# Patient Record
Sex: Female | Born: 1945
Health system: Southern US, Community
[De-identification: ages and names within clinical notes are randomized; demographics above are authoritative.]

## PROBLEM LIST (undated history)

## (undated) DIAGNOSIS — J309 Allergic rhinitis, unspecified: Secondary | ICD-10-CM

## (undated) DIAGNOSIS — M5126 Other intervertebral disc displacement, lumbar region: Secondary | ICD-10-CM

## (undated) DIAGNOSIS — D649 Anemia, unspecified: Secondary | ICD-10-CM

## (undated) DIAGNOSIS — J45909 Unspecified asthma, uncomplicated: Secondary | ICD-10-CM

## (undated) DIAGNOSIS — N809 Endometriosis, unspecified: Secondary | ICD-10-CM

## (undated) DIAGNOSIS — J189 Pneumonia, unspecified organism: Secondary | ICD-10-CM

## (undated) DIAGNOSIS — I340 Nonrheumatic mitral (valve) insufficiency: Secondary | ICD-10-CM

## (undated) DIAGNOSIS — N1831 Chronic kidney disease, stage 3a: Secondary | ICD-10-CM

## (undated) DIAGNOSIS — I10 Essential (primary) hypertension: Secondary | ICD-10-CM

## (undated) DIAGNOSIS — R42 Dizziness and giddiness: Secondary | ICD-10-CM

## (undated) DIAGNOSIS — H8109 Meniere's disease, unspecified ear: Secondary | ICD-10-CM

## (undated) DIAGNOSIS — H269 Unspecified cataract: Secondary | ICD-10-CM

## (undated) DIAGNOSIS — K635 Polyp of colon: Secondary | ICD-10-CM

## (undated) DIAGNOSIS — J449 Chronic obstructive pulmonary disease, unspecified: Secondary | ICD-10-CM

## (undated) HISTORY — PX: COLONOSCOPY: SHX174

## (undated) HISTORY — PX: ABDOMINAL HYSTERECTOMY: SHX81

## (undated) HISTORY — PX: FOOT SURGERY: SHX648

## (undated) HISTORY — PX: TOTAL ABDOMINAL HYSTERECTOMY W/ BILATERAL SALPINGOOPHORECTOMY: SHX83

## (undated) HISTORY — PX: TONSILLECTOMY: SUR1361

## (undated) HISTORY — PX: CATARACT EXTRACTION W/ INTRAOCULAR LENS  IMPLANT, BILATERAL: SHX1307

---

## 2007-03-21 DIAGNOSIS — B89 Unspecified parasitic disease: Secondary | ICD-10-CM

## 2007-03-21 HISTORY — DX: Unspecified parasitic disease: B89

## 2010-09-05 ENCOUNTER — Ambulatory Visit: Payer: Self-pay | Admitting: Family Medicine

## 2010-12-14 ENCOUNTER — Ambulatory Visit: Payer: Self-pay | Admitting: Ophthalmology

## 2011-01-11 ENCOUNTER — Ambulatory Visit: Payer: Self-pay | Admitting: Ophthalmology

## 2011-09-27 ENCOUNTER — Ambulatory Visit: Payer: Self-pay | Admitting: Family Medicine

## 2012-04-25 ENCOUNTER — Ambulatory Visit: Payer: Self-pay | Admitting: Podiatry

## 2012-05-07 ENCOUNTER — Ambulatory Visit: Payer: Self-pay | Admitting: Podiatry

## 2012-05-07 HISTORY — PX: FOOT SURGERY: SHX648

## 2012-05-13 ENCOUNTER — Inpatient Hospital Stay: Payer: Self-pay | Admitting: Internal Medicine

## 2012-05-13 ENCOUNTER — Ambulatory Visit: Payer: Self-pay | Admitting: Podiatry

## 2012-05-13 DIAGNOSIS — I2699 Other pulmonary embolism without acute cor pulmonale: Secondary | ICD-10-CM

## 2012-05-13 HISTORY — DX: Other pulmonary embolism without acute cor pulmonale: I26.99

## 2012-05-13 LAB — CBC
HGB: 11.1 g/dL — ABNORMAL LOW (ref 12.0–16.0)
MCH: 31.8 pg (ref 26.0–34.0)
RDW: 12.9 % (ref 11.5–14.5)
WBC: 12.3 10*3/uL — ABNORMAL HIGH (ref 3.6–11.0)

## 2012-05-13 LAB — BASIC METABOLIC PANEL
BUN: 35 mg/dL — ABNORMAL HIGH (ref 7–18)
Co2: 26 mmol/L (ref 21–32)
Creatinine: 1.48 mg/dL — ABNORMAL HIGH (ref 0.60–1.30)
EGFR (Non-African Amer.): 37 — ABNORMAL LOW
Glucose: 96 mg/dL (ref 65–99)
Osmolality: 276 (ref 275–301)
Potassium: 3.6 mmol/L (ref 3.5–5.1)

## 2012-05-13 LAB — APTT
Activated PTT: 31.5 secs (ref 23.6–35.9)
Activated PTT: 55.9 secs — ABNORMAL HIGH (ref 23.6–35.9)

## 2012-05-14 LAB — BASIC METABOLIC PANEL
Anion Gap: 8 (ref 7–16)
Creatinine: 1 mg/dL (ref 0.60–1.30)
EGFR (African American): >60
EGFR (Non-African Amer.): 59 — ABNORMAL LOW
Glucose: 94 mg/dL (ref 65–99)
Osmolality: 280 (ref 275–301)
Potassium: 3.5 mmol/L (ref 3.5–5.1)
Sodium: 138 mmol/L (ref 136–145)

## 2012-05-14 LAB — CBC WITH DIFFERENTIAL/PLATELET
Basophil %: 0.3 %
Lymphocyte #: 1.8 10*3/uL (ref 1.0–3.6)
MCH: 32.2 pg (ref 26.0–34.0)
MCV: 95 fL (ref 80–100)
Monocyte #: 0.8 x10 3/mm (ref 0.2–0.9)
Neutrophil #: 4.5 10*3/uL (ref 1.4–6.5)
Neutrophil %: 62 %
RDW: 12.9 % (ref 11.5–14.5)
WBC: 7.3 10*3/uL (ref 3.6–11.0)

## 2012-05-14 LAB — APTT
Activated PTT: 57.5 secs — ABNORMAL HIGH (ref 23.6–35.9)
Activated PTT: 54.4 secs — ABNORMAL HIGH (ref 23.6–35.9)

## 2012-05-14 LAB — HEPATIC FUNCTION PANEL A (ARMC)
Albumin: 2.7 g/dL — ABNORMAL LOW (ref 3.4–5.0)
Alkaline Phosphatase: 90 U/L (ref 50–136)
Bilirubin,Total: 0.3 mg/dL (ref 0.2–1.0)

## 2012-05-14 LAB — LIPID PANEL
Cholesterol: 147 mg/dL (ref 0–200)
HDL Cholesterol: 48 mg/dL (ref 40–60)
Triglycerides: 142 mg/dL (ref 0–200)

## 2012-05-15 ENCOUNTER — Ambulatory Visit: Payer: Self-pay | Admitting: Oncology

## 2012-07-16 ENCOUNTER — Ambulatory Visit: Payer: Self-pay | Admitting: Oncology

## 2012-07-18 ENCOUNTER — Ambulatory Visit: Payer: Self-pay | Admitting: Oncology

## 2012-09-27 ENCOUNTER — Ambulatory Visit: Payer: Self-pay | Admitting: Family Medicine

## 2014-01-29 ENCOUNTER — Ambulatory Visit: Payer: Self-pay | Admitting: Obstetrics and Gynecology

## 2014-03-24 ENCOUNTER — Emergency Department: Payer: Self-pay | Admitting: Emergency Medicine

## 2014-03-24 DIAGNOSIS — Z87891 Personal history of nicotine dependence: Secondary | ICD-10-CM | POA: Diagnosis not present

## 2014-03-24 DIAGNOSIS — K5792 Diverticulitis of intestine, part unspecified, without perforation or abscess without bleeding: Secondary | ICD-10-CM | POA: Diagnosis not present

## 2014-03-24 DIAGNOSIS — N281 Cyst of kidney, acquired: Secondary | ICD-10-CM | POA: Diagnosis not present

## 2014-03-24 DIAGNOSIS — K5732 Diverticulitis of large intestine without perforation or abscess without bleeding: Secondary | ICD-10-CM | POA: Diagnosis not present

## 2014-03-24 DIAGNOSIS — Z79899 Other long term (current) drug therapy: Secondary | ICD-10-CM | POA: Diagnosis not present

## 2014-03-24 DIAGNOSIS — R1030 Lower abdominal pain, unspecified: Secondary | ICD-10-CM | POA: Diagnosis not present

## 2014-03-24 LAB — URINALYSIS, COMPLETE
BILIRUBIN, UR: NEGATIVE
BLOOD: NEGATIVE
GLUCOSE, UR: NEGATIVE mg/dL (ref 0–75)
KETONE: NEGATIVE
Leukocyte Esterase: NEGATIVE
Nitrite: NEGATIVE
PH: 7 (ref 4.5–8.0)
Protein: NEGATIVE
RBC,UR: 1 /HPF (ref 0–5)
SPECIFIC GRAVITY: 1.016 (ref 1.003–1.030)
Squamous Epithelial: 3
WBC UR: <1 /HPF (ref 0–5)

## 2014-03-24 LAB — COMPREHENSIVE METABOLIC PANEL
ALT: 17 U/L
AST: 21 U/L (ref 15–37)
Albumin: 3.4 g/dL (ref 3.4–5.0)
Alkaline Phosphatase: 63 U/L
Anion Gap: 7 (ref 7–16)
BUN: 19 mg/dL — ABNORMAL HIGH (ref 7–18)
Bilirubin,Total: 0.2 mg/dL (ref 0.2–1.0)
Calcium, Total: 9.1 mg/dL (ref 8.5–10.1)
Chloride: 106 mmol/L (ref 98–107)
Co2: 28 mmol/L (ref 21–32)
Creatinine: 0.91 mg/dL (ref 0.60–1.30)
EGFR (African American): >60
EGFR (Non-African Amer.): >60
Glucose: 119 mg/dL — ABNORMAL HIGH (ref 65–99)
Osmolality: 285 (ref 275–301)
Potassium: 4 mmol/L (ref 3.5–5.1)
Sodium: 141 mmol/L (ref 136–145)
Total Protein: 7.1 g/dL (ref 6.4–8.2)

## 2014-03-24 LAB — CBC WITH DIFFERENTIAL/PLATELET
BASOS ABS: 0 10*3/uL (ref 0.0–0.1)
BASOS PCT: 0.4 %
Eosinophil #: 0.2 10*3/uL (ref 0.0–0.7)
Eosinophil %: 1.8 %
HCT: 36.9 % (ref 35.0–47.0)
HGB: 12.3 g/dL (ref 12.0–16.0)
LYMPHS ABS: 1.9 10*3/uL (ref 1.0–3.6)
Lymphocyte %: 21.4 %
MCH: 32.5 pg (ref 26.0–34.0)
MCHC: 33.3 g/dL (ref 32.0–36.0)
MCV: 98 fL (ref 80–100)
MONOS PCT: 6.7 %
Monocyte #: 0.6 x10 3/mm (ref 0.2–0.9)
NEUTROS ABS: 6.3 10*3/uL (ref 1.4–6.5)
Neutrophil %: 69.7 %
Platelet: 414 10*3/uL (ref 150–440)
RBC: 3.79 10*6/uL — AB (ref 3.80–5.20)
RDW: 12.7 % (ref 11.5–14.5)
WBC: 9 10*3/uL (ref 3.6–11.0)

## 2014-03-24 LAB — LIPASE, BLOOD: LIPASE: 60 U/L — AB (ref 73–393)

## 2014-03-25 DIAGNOSIS — N281 Cyst of kidney, acquired: Secondary | ICD-10-CM | POA: Diagnosis not present

## 2014-03-25 DIAGNOSIS — R1030 Lower abdominal pain, unspecified: Secondary | ICD-10-CM | POA: Diagnosis not present

## 2014-03-25 DIAGNOSIS — Z79899 Other long term (current) drug therapy: Secondary | ICD-10-CM | POA: Diagnosis not present

## 2014-03-25 DIAGNOSIS — Z87891 Personal history of nicotine dependence: Secondary | ICD-10-CM | POA: Diagnosis not present

## 2014-03-25 DIAGNOSIS — K5792 Diverticulitis of intestine, part unspecified, without perforation or abscess without bleeding: Secondary | ICD-10-CM | POA: Diagnosis not present

## 2014-03-25 DIAGNOSIS — K5732 Diverticulitis of large intestine without perforation or abscess without bleeding: Secondary | ICD-10-CM | POA: Diagnosis not present

## 2014-06-01 DIAGNOSIS — H40003 Preglaucoma, unspecified, bilateral: Secondary | ICD-10-CM | POA: Diagnosis not present

## 2014-06-26 DIAGNOSIS — H40003 Preglaucoma, unspecified, bilateral: Secondary | ICD-10-CM | POA: Diagnosis not present

## 2014-06-29 ENCOUNTER — Ambulatory Visit
Admit: 2014-06-29 | Disposition: A | Discharge status: Home / Self Care | Payer: Self-pay | Attending: Gastroenterology | Admitting: Gastroenterology

## 2014-06-29 DIAGNOSIS — Z833 Family history of diabetes mellitus: Secondary | ICD-10-CM | POA: Diagnosis not present

## 2014-06-29 DIAGNOSIS — Z87891 Personal history of nicotine dependence: Secondary | ICD-10-CM | POA: Diagnosis not present

## 2014-06-29 DIAGNOSIS — Z8489 Family history of other specified conditions: Secondary | ICD-10-CM | POA: Diagnosis not present

## 2014-06-29 DIAGNOSIS — D128 Benign neoplasm of rectum: Secondary | ICD-10-CM | POA: Diagnosis not present

## 2014-06-29 DIAGNOSIS — Z1211 Encounter for screening for malignant neoplasm of colon: Secondary | ICD-10-CM | POA: Diagnosis not present

## 2014-06-29 DIAGNOSIS — Z79899 Other long term (current) drug therapy: Secondary | ICD-10-CM | POA: Diagnosis not present

## 2014-06-29 DIAGNOSIS — K621 Rectal polyp: Secondary | ICD-10-CM | POA: Diagnosis not present

## 2014-06-29 DIAGNOSIS — I10 Essential (primary) hypertension: Secondary | ICD-10-CM | POA: Diagnosis not present

## 2014-06-29 DIAGNOSIS — Z800 Family history of malignant neoplasm of digestive organs: Secondary | ICD-10-CM | POA: Diagnosis not present

## 2014-06-29 DIAGNOSIS — J309 Allergic rhinitis, unspecified: Secondary | ICD-10-CM | POA: Diagnosis not present

## 2014-07-10 NOTE — Discharge Summary (Signed)
PATIENT NAME:  Sharon Watson, Sharon Watson MR#:  [id number] DATE OF BIRTH:  12/29/1945  DATE OF ADMISSION:  05/13/2012 DATE OF DISCHARGE:  05/15/2012  DISCHARGE DIAGNOSES:   1.  Pulmonary embolism on Xarelto started here and will need to be continued for at least 3 months.  2.  Acute renal failure, now resolved, likely prerenal in etiology.   SECONDARY DIAGNOSIS:  Hypertension.   CONSULTATIONS:  None.   PROCEDURES AND RADIOLOGY:  Bilateral lower extremity Doppler was negative for DVT.   Chest x-ray on admission showed atelectasis.   V/Q scan on the 24th of February showed high probability for PE.   MAJOR LABORATORY PANEL:  Serum protein C and S still pending. Free protein S seems elevated with a value of 148%. Antithrombin III was within normal limits.   HISTORY AND SHORT HOSPITAL COURSE:  The patient is a 69 year old female with the above-mentioned medical problems who was admitted for PE. The patient had a right foot surgery by Dr. Elvina Mattes about 6 to 7 days ago and this was thought to be possibly postoperative complication mainly due to immobility.   She could not get a CT scan so she had a V/Q scan which showed a high probability for PE and was started on full dose anticoagulation and was subsequently switched to Xarelto and was discharged home on the 26th of February as Xarelto was approved by her insurance company. Clinically, she was feeling much better. On the date of discharge, her vital signs are as follows: Temperature 99, heart rate 91 per minute, respirations 18 per minute, blood pressure 127/78 mmHg, she was saturating 94% on room air.   PERTINENT PHYSICAL EXAMINATION ON THE DATE OF DISCHARGE:  CARDIOVASCULAR: S1, S2 normal. No murmurs, rubs or gallop.  LUNGS: Clear to auscultation bilaterally. No wheezing, rales, rhonchi or crepitation.  ABDOMEN: Soft, benign.  NEUROLOGIC: Nonfocal examination.  EXTREMITIES: She had a cast present on her right foot.   All other physical examination  remained at baseline.   DISCHARGE MEDICATIONS:  Hydrochlorothiazide/lisinopril 12.5/10 mg 1 tablet p.o. daily, Claritin 10 mg p.o. daily, Centrum Silver once daily, Caltrate with vitamin D 2 tablets p.o. daily, Estrace 0.5 mg p.o. daily, Xarelto 15 mg p.o. b.i.d. for 20 days followed by 20 mg once daily for the rest of the course for at least 3 months.   DISCHARGE DIET:  Regular.   DISCHARGE ACTIVITY:  As tolerated.   DISCHARGE INSTRUCTIONS AND FOLLOWUP:  The patient was instructed to follow up with her primary care physician, Dr. Maryland Pink, in 1 to 2 weeks. She will need to follow up with her podiatrist, Dr. Elvina Mattes, in 2 to 4 weeks and oncologist, Dr. Grayland Ormond, in 2 to 3 months.   TOTAL TIME DISCHARGING THIS PATIENT:  45 minutes.    ____________________________ Lucina Mellow. Manuella Ghazi, MD vss:si D: 05/16/2012 14:25:00 ET T: 05/16/2012 17:12:19 ET JOB#: [id number]  cc: Gracia Saggese S. Manuella Ghazi, MD, <Dictator> Irven Easterly. Kary Kos, MD Gerrit Heck Troxler, DPM Kathlene November. Grayland Ormond, Kimball MD ELECTRONICALLY SIGNED 05/18/2012 15:28

## 2014-07-10 NOTE — H&P (Signed)
PATIENT NAME:  Sharon Watson, Sharon Watson MR#:  [id number] DATE OF BIRTH:  August 31, 1945  DATE OF ADMISSION:  05/13/2012  NO DICTATION  ____________________________ Ceasar Lund. Anselm Jungling, MD vgv:cc D: 05/13/2012 16:30:06 ET T: 05/13/2012 17:23:18 ET JOB#: [id number]  cc: Ceasar Lund. Anselm Jungling, MD, <Dictator> Vaughan Basta MD ELECTRONICALLY SIGNED 05/19/2012 23:06

## 2014-07-10 NOTE — H&P (Signed)
PATIENT NAME:  Sharon Watson, HALLIDAY MR#:  [id number] DATE OF BIRTH:  Nov 04, 1945  DATE OF ADMISSION:  05/13/2012  PRIMARY CARE PHYSICIAN: Irven Easterly. Kary Kos, MD at [facility]  ER REFERRING PHYSICIAN: Orlie Dakin, MD   CHIEF COMPLAINT: Shortness of breath and dizziness.   HISTORY OF PRESENT ILLNESS: The patient is a 69 year old female with past medical history of hypertension, multiple allergies to dust and pollens.  She underwent her right foot surgery for varus deformity, and she had a flat foot. Surgery was done 6 days ago at [facility] by Dr. Elvina Mattes. After the surgery, she was having a cast on her right foot and leg and was sent home with nonweightbearing exercise advised. She was supposed to walk with a walker only.  That is also nonweightbearing.  So, she had minimal activities for the last one week. Today she had her postop regular visit in the clinic.  When she followed with Dr. Elvina Mattes in the clinic today, she was complaining of shortness of breath, and she was feeling dizzy for the last 2 to 3 days; and so she was unable to do any more activities.  She says that shortness of breath is worse with any minimal exertion, and dizziness happens whenever she tries to get up.  Dr. Elvina Mattes suspected pulmonary embolism. He checked her renal function and creatinine was around 1.5, so he could not do CT for pulmonary embolism. He did a V/Q scan, and it is having very high probability of PE, and so she was sent for admission. On further questioning, the patient denies any complaint of cough, fever, chest pain, palpitations,   REVIEW OF SYSTEMS:   CONSTITUTIONAL: Negative for fever, fatigue, weakness, pain or weight loss.  EYES: No blurring, double vision, pain or redness or inflammation.  ENT: No tinnitus, ear pain, hearing loss.  RESPIRATORY: No cough, wheezing, but has some shortness of breath.  CARDIOVASCULAR: No chest pain, orthopnea arrhythmia or palpitations, but has some  dizziness.  GASTROINTESTINAL: No nausea, vomiting, diarrhea, abdominal pain.  GENITOURINARY: No dysuria, hematuria, or increased frequency.  ENDOCRINE: No polyuria, nocturia, or intolerance to heat or cold.  MUSCULOSKELETAL: No swelling or painful joints.  NEUROLOGICAL: No numbness, weakness, dysarthria or tremors.   PSYCHIATRIC: No anxiety, insomnia or depression.   PAST MEDICAL HISTORY: Positive for hypertension and allergic to pollens.   PAST SURGICAL HISTORY: Hysterectomy 30 years ago.   SOCIAL HISTORY: She is not a current smoker, but she used to be a smoker, almost 1 pack per day. She stopped 20 years ago. She denies any alcohol use or any drug use. 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   FAMILY HISTORY: Grandfather had cancer. She does not know which type. Grandmother died of a heart attack at old age, and her mother's brother has diabetes.   HOME MEDICATIONS: Lisinopril and hydrochlorothiazide combination pill. She does not remember the dose. Claritin daily for allergic reactions. Vitamin supplements and calcium.   PHYSICAL EXAMINATION:  VITAL SIGNS: Temperature 97.5, pulse rate 89, respirations 11, blood pressure 112/72 and oxygen saturation 92 percent on room air.  GENERAL: She is fully alert, oriented to time, place, and person and does not appear in any acute distress. She is cooperative with history taking and physical examination.  HEENT: Head and neck atraumatic. Conjunctivae pink. Oral mucosa moist.  NECK: Supple. No JVD.  RESPIRATORY: Bilateral clear and equal air entry. No crepitation or rhonchi.  CARDIOVASCULAR: S1, S2 present, regular. No murmur. No  JVD.  ABDOMEN: Soft, nontender. Bowel sounds present. No organomegaly.  SKIN: No rashes.  EXTREMITIES: Right foot and leg up to below knee is in the cast for immobilization after the surgery. Above that, there is not any significant swelling or tenderness present. Left leg no edema. No calf tenderness.   NEUROLOGICAL: Moves all 4 limbs. Power 5 out of 5, no tremors.  PSYCHIATRIC: Does not appear in any acute gross psychiatric illness.   LABORATORY AND DIAGNOSTIC DATA:  Glucose 96 BUN 35, creatinine 1.48, sodium 134, potassium 3.6, chloride 102, CO2 26, calcium 9.1. WBC 12.3, hemoglobin 11.1, hematocrit 33.6, platelet count 347 and MCV 96.   Chest x-ray, PA and lateral: Minimal basilar opacities  likely secondary to atelectasis.  Lung V/Q scan is done: It shows high probability of pulmonary embolism Doppler ultrasound of the lower extremities could be performed if the patient's renal function improves.   ASSESSMENT AND PLAN: This is a 69 year old female with past medical history of hypertension and multiple allergic reactions, with recent right foot surgery and having an immobilization cast and nonweightbearing activities on the right side after that, came due to dizziness and shortness of breath for the last 3 to 4 days which is worse on exertion or trying to get up.   1. Pulmonary embolism, is having high probability on V/Q scan: We cannot do CT pulmonary angiogram due to altered renal function, and she is a very high risk due to immobilization and not any use of anticoagulant for the last 1 week, with positive V/Q scan.  So, we will start her on treatment for that. She is already started on heparin IV drip by ER physician. Currently she is feeling dizzy when she tries to get up, and her blood pressure is running on the lower normal side while lying down; so, she needs to be in the hospital and monitored until she improves. She is also mildly hypoxic with minimal exertion, and we need to keep her in the hospital until these conditions improve.  I spoke to Dr. Grayland Ormond on the phone just to get his guidance about this condition. As per him, as the patient does not have any strong family history or past history about clotting problems or hypercoagulable state, and she has a very good reason of having  pulmonary embolism, we can avoid testing her for hypercoagulable state and just treat her for 3 to 4 months for her PE.  2. Renal insufficiency: We do not have any previous records to compare, but it looks like acute, so we will give her IV fluid and follow up tomorrow morning.  3. Hypertension: She takes lisinopril and hydrochlorothiazide at home, but currently her blood pressure is running normal systolic slightly on the lower side, so I would like to avoid any hypertensive medication at this time.  4. Recent foot surgery: We will do the pain management at this time, and she can follow with her surgeon doctor once discharged.   CODE STATUS:  FULL CODE.     TOTAL TIME SPENT: 50 minutes.  ____________________________ Ceasar Lund Anselm Jungling, MD vgv:cb D: 05/13/2012 16:29:36 ET T: 05/13/2012 17:32:29 ET JOB#: [id number] cc: Ceasar Lund. Anselm Jungling, MD, <Dictator> Irven Easterly. Kary Kos, MD Vaughan Basta MD ELECTRONICALLY SIGNED 05/19/2012 23:06

## 2014-07-13 LAB — SURGICAL PATHOLOGY

## 2014-07-29 DIAGNOSIS — J019 Acute sinusitis, unspecified: Secondary | ICD-10-CM | POA: Diagnosis not present

## 2014-07-29 DIAGNOSIS — B9689 Other specified bacterial agents as the cause of diseases classified elsewhere: Secondary | ICD-10-CM | POA: Diagnosis not present

## 2014-09-01 DIAGNOSIS — B9689 Other specified bacterial agents as the cause of diseases classified elsewhere: Secondary | ICD-10-CM | POA: Diagnosis not present

## 2014-09-01 DIAGNOSIS — J019 Acute sinusitis, unspecified: Secondary | ICD-10-CM | POA: Diagnosis not present

## 2014-09-08 DIAGNOSIS — I10 Essential (primary) hypertension: Secondary | ICD-10-CM | POA: Diagnosis not present

## 2014-11-27 DIAGNOSIS — D6489 Other specified anemias: Secondary | ICD-10-CM | POA: Diagnosis not present

## 2014-11-30 DIAGNOSIS — M5440 Lumbago with sciatica, unspecified side: Secondary | ICD-10-CM | POA: Diagnosis not present

## 2014-11-30 DIAGNOSIS — Z0000 Encounter for general adult medical examination without abnormal findings: Secondary | ICD-10-CM | POA: Diagnosis not present

## 2014-11-30 DIAGNOSIS — D508 Other iron deficiency anemias: Secondary | ICD-10-CM | POA: Diagnosis not present

## 2014-11-30 DIAGNOSIS — I10 Essential (primary) hypertension: Secondary | ICD-10-CM | POA: Diagnosis not present

## 2014-12-24 DIAGNOSIS — M4726 Other spondylosis with radiculopathy, lumbar region: Secondary | ICD-10-CM | POA: Diagnosis not present

## 2014-12-24 DIAGNOSIS — M545 Low back pain: Secondary | ICD-10-CM | POA: Diagnosis not present

## 2014-12-24 DIAGNOSIS — M62830 Muscle spasm of back: Secondary | ICD-10-CM | POA: Diagnosis not present

## 2014-12-24 DIAGNOSIS — M5136 Other intervertebral disc degeneration, lumbar region: Secondary | ICD-10-CM | POA: Diagnosis not present

## 2014-12-30 DIAGNOSIS — M5136 Other intervertebral disc degeneration, lumbar region: Secondary | ICD-10-CM | POA: Diagnosis not present

## 2014-12-31 DIAGNOSIS — H40003 Preglaucoma, unspecified, bilateral: Secondary | ICD-10-CM | POA: Diagnosis not present

## 2015-01-07 DIAGNOSIS — M6281 Muscle weakness (generalized): Secondary | ICD-10-CM | POA: Diagnosis not present

## 2015-01-10 DIAGNOSIS — B9689 Other specified bacterial agents as the cause of diseases classified elsewhere: Secondary | ICD-10-CM | POA: Diagnosis not present

## 2015-01-10 DIAGNOSIS — J019 Acute sinusitis, unspecified: Secondary | ICD-10-CM | POA: Diagnosis not present

## 2015-01-19 DIAGNOSIS — M6281 Muscle weakness (generalized): Secondary | ICD-10-CM | POA: Diagnosis not present

## 2015-01-21 ENCOUNTER — Other Ambulatory Visit: Payer: Self-pay | Admitting: Obstetrics and Gynecology

## 2015-01-21 DIAGNOSIS — Z1231 Encounter for screening mammogram for malignant neoplasm of breast: Secondary | ICD-10-CM

## 2015-01-21 DIAGNOSIS — M6281 Muscle weakness (generalized): Secondary | ICD-10-CM | POA: Diagnosis not present

## 2015-01-21 DIAGNOSIS — Z124 Encounter for screening for malignant neoplasm of cervix: Secondary | ICD-10-CM | POA: Diagnosis not present

## 2015-01-28 DIAGNOSIS — M5136 Other intervertebral disc degeneration, lumbar region: Secondary | ICD-10-CM | POA: Diagnosis not present

## 2015-02-01 DIAGNOSIS — J019 Acute sinusitis, unspecified: Secondary | ICD-10-CM | POA: Diagnosis not present

## 2015-02-01 DIAGNOSIS — B9689 Other specified bacterial agents as the cause of diseases classified elsewhere: Secondary | ICD-10-CM | POA: Diagnosis not present

## 2015-02-01 DIAGNOSIS — R6883 Chills (without fever): Secondary | ICD-10-CM | POA: Diagnosis not present

## 2015-02-01 DIAGNOSIS — R52 Pain, unspecified: Secondary | ICD-10-CM | POA: Diagnosis not present

## 2015-02-02 ENCOUNTER — Ambulatory Visit: Payer: Self-pay

## 2015-02-15 ENCOUNTER — Ambulatory Visit
Admission: RE | Admit: 2015-02-15 | Discharge: 2015-02-15 | Disposition: A | Discharge status: Home / Self Care | Payer: Commercial Managed Care - HMO | Source: Ambulatory Visit | Attending: Obstetrics and Gynecology | Admitting: Obstetrics and Gynecology

## 2015-02-15 ENCOUNTER — Other Ambulatory Visit: Payer: Self-pay | Admitting: Obstetrics and Gynecology

## 2015-02-15 DIAGNOSIS — Z1231 Encounter for screening mammogram for malignant neoplasm of breast: Secondary | ICD-10-CM | POA: Insufficient documentation

## 2015-04-13 DIAGNOSIS — B9689 Other specified bacterial agents as the cause of diseases classified elsewhere: Secondary | ICD-10-CM | POA: Diagnosis not present

## 2015-04-13 DIAGNOSIS — J019 Acute sinusitis, unspecified: Secondary | ICD-10-CM | POA: Diagnosis not present

## 2015-06-29 DIAGNOSIS — H40003 Preglaucoma, unspecified, bilateral: Secondary | ICD-10-CM | POA: Diagnosis not present

## 2015-07-06 DIAGNOSIS — H40003 Preglaucoma, unspecified, bilateral: Secondary | ICD-10-CM | POA: Diagnosis not present

## 2015-07-31 DIAGNOSIS — J0100 Acute maxillary sinusitis, unspecified: Secondary | ICD-10-CM | POA: Diagnosis not present

## 2015-07-31 DIAGNOSIS — R05 Cough: Secondary | ICD-10-CM | POA: Diagnosis not present

## 2015-08-17 DIAGNOSIS — R399 Unspecified symptoms and signs involving the genitourinary system: Secondary | ICD-10-CM | POA: Diagnosis not present

## 2015-08-17 DIAGNOSIS — N390 Urinary tract infection, site not specified: Secondary | ICD-10-CM | POA: Diagnosis not present

## 2015-10-10 DIAGNOSIS — J019 Acute sinusitis, unspecified: Secondary | ICD-10-CM | POA: Diagnosis not present

## 2015-10-10 DIAGNOSIS — B9689 Other specified bacterial agents as the cause of diseases classified elsewhere: Secondary | ICD-10-CM | POA: Diagnosis not present

## 2015-11-23 DIAGNOSIS — I10 Essential (primary) hypertension: Secondary | ICD-10-CM | POA: Diagnosis not present

## 2015-11-30 DIAGNOSIS — J0101 Acute recurrent maxillary sinusitis: Secondary | ICD-10-CM | POA: Diagnosis not present

## 2015-11-30 DIAGNOSIS — R011 Cardiac murmur, unspecified: Secondary | ICD-10-CM | POA: Diagnosis not present

## 2015-11-30 DIAGNOSIS — Z0000 Encounter for general adult medical examination without abnormal findings: Secondary | ICD-10-CM | POA: Diagnosis not present

## 2015-11-30 DIAGNOSIS — I10 Essential (primary) hypertension: Secondary | ICD-10-CM | POA: Diagnosis not present

## 2015-11-30 DIAGNOSIS — R002 Palpitations: Secondary | ICD-10-CM | POA: Diagnosis not present

## 2015-12-16 DIAGNOSIS — R002 Palpitations: Secondary | ICD-10-CM | POA: Diagnosis not present

## 2015-12-22 ENCOUNTER — Emergency Department: Payer: Commercial Managed Care - HMO

## 2015-12-22 ENCOUNTER — Emergency Department
Admission: EM | Admit: 2015-12-22 | Discharge: 2015-12-23 | Disposition: A | Discharge status: Home / Self Care | Payer: Commercial Managed Care - HMO | Attending: Emergency Medicine | Admitting: Emergency Medicine

## 2015-12-22 ENCOUNTER — Encounter: Payer: Self-pay | Admitting: *Deleted

## 2015-12-22 DIAGNOSIS — I10 Essential (primary) hypertension: Secondary | ICD-10-CM | POA: Insufficient documentation

## 2015-12-22 DIAGNOSIS — R1030 Lower abdominal pain, unspecified: Secondary | ICD-10-CM | POA: Diagnosis not present

## 2015-12-22 DIAGNOSIS — K5900 Constipation, unspecified: Secondary | ICD-10-CM

## 2015-12-22 DIAGNOSIS — K5732 Diverticulitis of large intestine without perforation or abscess without bleeding: Secondary | ICD-10-CM | POA: Diagnosis not present

## 2015-12-22 DIAGNOSIS — K5790 Diverticulosis of intestine, part unspecified, without perforation or abscess without bleeding: Secondary | ICD-10-CM | POA: Diagnosis not present

## 2015-12-22 HISTORY — DX: Essential (primary) hypertension: I10

## 2015-12-22 LAB — COMPREHENSIVE METABOLIC PANEL
ALK PHOS: 48 U/L (ref 38–126)
ALT: 12 U/L — AB (ref 14–54)
ANION GAP: 7 (ref 5–15)
AST: 18 U/L (ref 15–41)
Albumin: 3.8 g/dL (ref 3.5–5.0)
BILIRUBIN TOTAL: 0.5 mg/dL (ref 0.3–1.2)
BUN: 21 mg/dL — ABNORMAL HIGH (ref 6–20)
CALCIUM: 9.4 mg/dL (ref 8.9–10.3)
CO2: 26 mmol/L (ref 22–32)
CREATININE: 0.93 mg/dL (ref 0.44–1.00)
Chloride: 106 mmol/L (ref 101–111)
GFR calc non Af Amer: >60 mL/min (ref >60)
GLUCOSE: 107 mg/dL — AB (ref 65–99)
Potassium: 3.8 mmol/L (ref 3.5–5.1)
Sodium: 139 mmol/L (ref 135–145)
TOTAL PROTEIN: 7.2 g/dL (ref 6.5–8.1)

## 2015-12-22 LAB — URINALYSIS COMPLETE WITH MICROSCOPIC (ARMC ONLY)
BILIRUBIN URINE: NEGATIVE
GLUCOSE, UA: NEGATIVE mg/dL
Hgb urine dipstick: NEGATIVE
Leukocytes, UA: NEGATIVE
NITRITE: NEGATIVE
Protein, ur: NEGATIVE mg/dL
SPECIFIC GRAVITY, URINE: 1.021 (ref 1.005–1.030)
pH: 5 (ref 5.0–8.0)

## 2015-12-22 LAB — CBC
HCT: 39.2 % (ref 35.0–47.0)
HEMOGLOBIN: 13.2 g/dL (ref 12.0–16.0)
MCH: 32.1 pg (ref 26.0–34.0)
MCHC: 33.7 g/dL (ref 32.0–36.0)
MCV: 95.1 fL (ref 80.0–100.0)
PLATELETS: 422 10*3/uL (ref 150–440)
RBC: 4.12 MIL/uL (ref 3.80–5.20)
RDW: 13.5 % (ref 11.5–14.5)
WBC: 9.1 10*3/uL (ref 3.6–11.0)

## 2015-12-22 LAB — LIPASE, BLOOD: Lipase: 17 U/L (ref 11–51)

## 2015-12-22 LAB — TROPONIN I: Troponin I: <0.03 ng/mL (ref <0.03)

## 2015-12-22 MED ORDER — MORPHINE SULFATE (PF) 2 MG/ML IV SOLN
2.0000 mg | Freq: Once | INTRAVENOUS | Status: AC
  Administered 2015-12-22: 2 mg via INTRAVENOUS
  Filled 2015-12-22: qty 1

## 2015-12-22 MED ORDER — ONDANSETRON HCL 4 MG/2ML IJ SOLN
4.0000 mg | Freq: Once | INTRAMUSCULAR | Status: AC
  Administered 2015-12-22: 4 mg via INTRAVENOUS
  Filled 2015-12-22: qty 2

## 2015-12-22 MED ORDER — SODIUM CHLORIDE 0.9 % IV BOLUS (SEPSIS)
1000.0000 mL | Freq: Once | INTRAVENOUS | Status: AC
  Administered 2015-12-22: 1000 mL via INTRAVENOUS

## 2015-12-22 MED ORDER — DOCUSATE SODIUM 100 MG PO CAPS: ORAL_CAPSULE | ORAL | 0 refills | Status: DC

## 2015-12-22 MED ORDER — CIPROFLOXACIN HCL 500 MG PO TABS: 500.0000 mg | ORAL_TABLET | Freq: Two times a day (BID) | ORAL | 0 refills | Status: AC

## 2015-12-22 MED ORDER — METRONIDAZOLE 500 MG PO TABS: 500.0000 mg | ORAL_TABLET | Freq: Three times a day (TID) | ORAL | 0 refills | Status: AC

## 2015-12-22 MED ORDER — MORPHINE SULFATE (PF) 2 MG/ML IV SOLN
2.0000 mg | Freq: Once | INTRAVENOUS | Status: AC
  Administered 2015-12-22: 2 mg via INTRAVENOUS

## 2015-12-22 MED ORDER — IOPAMIDOL (ISOVUE-300) INJECTION 61%
100.0000 mL | Freq: Once | INTRAVENOUS | Status: AC | PRN
  Administered 2015-12-22: 100 mL via INTRAVENOUS
  Filled 2015-12-22: qty 100

## 2015-12-22 MED ORDER — HYDROCODONE-ACETAMINOPHEN 5-325 MG PO TABS: 1.0000 | ORAL_TABLET | ORAL | 0 refills | Status: DC | PRN

## 2015-12-22 MED ORDER — MORPHINE SULFATE (PF) 2 MG/ML IV SOLN
INTRAVENOUS | Status: AC
  Administered 2015-12-22: 2 mg via INTRAVENOUS
  Filled 2015-12-22: qty 1

## 2015-12-22 MED ORDER — IOPAMIDOL (ISOVUE-300) INJECTION 61%
30.0000 mL | Freq: Once | INTRAVENOUS | Status: AC
  Administered 2015-12-22: 30 mL via ORAL
  Filled 2015-12-22: qty 30

## 2015-12-22 NOTE — ED Provider Notes (Signed)
[facility] Emergency Department Provider Note    ____________________________________________   I have reviewed the triage vital signs and the nursing notes.   HISTORY  Chief Complaint Abdominal Pain and Diarrhea   History limited by: Not Limited   HPI Sharon Watson is a 70 y.o. female who presents to the emergency department today because of concerns for abdominal pain. It is located in the lower abdomen. It started yesterday. It comes and goes in waves. It is described as being crampy in quality. It has been associated with multiple episodes of nonbloody diarrhea. Patient has not had any significant nausea or vomiting. No fevers.    No past medical history on file.  There are no active problems to display for this patient.   No past surgical history on file.  Prior to Admission medications   Not on File    Allergies Review of patient's allergies indicates no known allergies.  Family History  Problem Relation Age of Onset  . Breast cancer Neg Hx     Social History Social History  Substance Use Topics  . Smoking status: Never Smoker  . Smokeless tobacco: Never Used  . Alcohol use Yes    Review of Systems  Constitutional: Negative for fever. Cardiovascular: Negative for chest pain. Respiratory: Negative for shortness of breath. Gastrointestinal: Positive for lower abdominal pain. Genitourinary: Negative for dysuria. Musculoskeletal: Negative for back pain. Skin: Negative for rash. Neurological: Negative for headaches, focal weakness or numbness.  10-point ROS otherwise negative.  ____________________________________________   PHYSICAL EXAM:  VITAL SIGNS: ED Triage Vitals  Enc Vitals Group     BP 12/22/15 1834 133/78     Pulse Rate 12/22/15 1834 81     Resp 12/22/15 1834 20     Temp 12/22/15 1834 98.2 F (36.8 C)     Temp Source 12/22/15 1834 Oral     SpO2 12/22/15 1834 96 %     Weight 12/22/15 1835 158 lb (71.7 kg)      Height 12/22/15 1835 5\' 6"  (1.676 m)     Head Circumference --      Peak Flow --      Pain Score 12/22/15 1835 2   Constitutional: Alert and oriented. Well appearing and in no distress. Eyes: Conjunctivae are normal. Normal extraocular movements. ENT   Head: Normocephalic and atraumatic.   Nose: No congestion/rhinnorhea.   Mouth/Throat: Mucous membranes are moist.   Neck: No stridor. Hematological/Lymphatic/Immunilogical: No cervical lymphadenopathy. Cardiovascular: Normal rate, regular rhythm.  No murmurs, rubs, or gallops. Respiratory: Normal respiratory effort without tachypnea nor retractions. Breath sounds are clear and equal bilaterally. No wheezes/rales/rhonchi. Gastrointestinal: Soft and tender to palpation in the lower abdomen.  No rebound. No guarding.  Genitourinary: Deferred Musculoskeletal: Normal range of motion in all extremities. No lower extremity edema. Neurologic:  Normal speech and language. No gross focal neurologic deficits are appreciated.  Skin:  Skin is warm, dry and intact. No rash noted. Psychiatric: Mood and affect are normal. Speech and behavior are normal. Patient exhibits appropriate insight and judgment.  ____________________________________________    LABS (pertinent positives/negatives)  Labs Reviewed  COMPREHENSIVE METABOLIC PANEL - Abnormal; Notable for the following:       Result Value   Glucose, Bld 107 (*)    BUN 21 (*)    ALT 12 (*)    All other components within normal limits  URINALYSIS COMPLETEWITH MICROSCOPIC ([facility] ONLY) - Abnormal; Notable for the following:    Color, Urine YELLOW (*)  APPearance HAZY (*)    Ketones, ur TRACE (*)    Bacteria, UA RARE (*)    Squamous Epithelial / LPF 0-5 (*)    All other components within normal limits  LIPASE, BLOOD  CBC  TROPONIN I     ____________________________________________   EKG  I, Nance Pear, attending physician, personally viewed and interpreted this  EKG  EKG Time: 1842 Rate: 74 Rhythm: normal sinus rhythm Axis: normal Intervals: qtc 430 QRS: narrow ST changes: no st elevation Impression: normal ekg   ____________________________________________    RADIOLOGY  CT abd/pel pending at time of signout  ____________________________________________   PROCEDURES  Procedures  ____________________________________________   INITIAL IMPRESSION / ASSESSMENT AND PLAN / ED COURSE  Pertinent labs & imaging results that were available during my care of the patient were reviewed by me and considered in my medical decision making (see chart for details).  Patient presents to the emergency department today with concerns for abdominal pain and diarrhea. Patient states that on call and also she was told she had diverticular disease. I certainly have concern for diverticulitis. Will get ct scan to evaluate   ____________________________________________   FINAL CLINICAL IMPRESSION(S) / ED DIAGNOSES  Abdominal pain  Note: This dictation was prepared with Dragon dictation. Any transcriptional errors that result from this process are unintentional    Nance Pear, MD 12/23/15 1534

## 2015-12-22 NOTE — ED Notes (Signed)
Pt unable to void  At this time.

## 2015-12-22 NOTE — ED Provider Notes (Signed)
-----------------------------------------   11:41 PM on 12/22/2015 -----------------------------------------   Blood pressure 133/78, pulse 81, temperature 98.2 F (36.8 C), temperature source Oral, resp. rate 20, height 5\' 6"  (1.676 m), weight 71.7 kg, SpO2 96 %.  Assuming care from Dr. Archie Balboa.  In short, Sharon Watson is a 70 y.o. female with a chief complaint of Abdominal Pain and Diarrhea .  Refer to the original H&P for additional details.  The current plan of care is to follow up on the CT scan for probable diverticulitis and disposition appropriately.    ----------------------------------------- 11:59 PM on 12/22/2015 -----------------------------------------  Ct Abdomen Pelvis W Contrast  Result Date: 12/22/2015 CLINICAL DATA:  Diarrhea and abdominal pain. History of diverticulitis, hypertension and hysterectomy. EXAM: CT ABDOMEN AND PELVIS WITH CONTRAST TECHNIQUE: Multidetector CT imaging of the abdomen and pelvis was performed using the standard protocol following bolus administration of intravenous contrast. CONTRAST:  148mL ISOVUE-300 IOPAMIDOL (ISOVUE-300) INJECTION 61% COMPARISON:  CT abdomen and pelvis March 25, 2014 FINDINGS: LOWER CHEST: Lung bases are clear. Included heart size is normal. No pericardial effusion. Residual versus refluxed contrast in the distal esophagus. HEPATOBILIARY: Stable 10 mm cyst RIGHT lobe of the liver, which is otherwise unremarkable. PANCREAS: Normal. SPLEEN: Normal. ADRENALS/URINARY TRACT: Kidneys are orthotopic, demonstrating symmetric enhancement. No nephrolithiasis, hydronephrosis or solid renal masses. 2.8 cm LEFT upper pole cyst. Too small to characterize hypodensity lower pole RIGHT kidney. The unopacified ureters are normal in course and caliber. Delayed imaging through the kidneys demonstrates symmetric prompt contrast excretion within the proximal urinary collecting system. Urinary bladder is well distended and unremarkable. Normal RIGHT  adrenal gland. Stable 17 mm LEFT adrenal nodule with greater than 50% washout compatible with benign adenoma. If there are clinical signs or symptoms of adrenal hyperfunction, biochemical evaluation may be appropriate. STOMACH/BOWEL: Mild colonic diverticulosis with superimposed short segment of similar sigmoid colonic wall thickening and pericolonic inflammation. The stomach, small bowel are normal in course and caliber without inflammatory changes. Enteric contrast has not yet reached the distal small bowel. Moderate amount of retained large bowel stool. Normal appendix. VASCULAR/LYMPHATIC: Aortoiliac vessels are normal in course and caliber, moderate calcific atherosclerosis. No lymphadenopathy by CT size criteria. REPRODUCTIVE: Status post hysterectomy. OTHER: Trace pericolonic free fluid in the pelvis. No focal fluid collection. No subcutaneous gas. MUSCULOSKELETAL: Nonacute. Multilevel severe degenerative discs. Severe LEFT L4-5 and bilateral L5-S1 neural foraminal narrowing. IMPRESSION: Acute recurrent sigmoid diverticulitis. Moderate amount of retained large bowel stool without bowel obstruction. Moderate atherosclerosis. Electronically Signed   By: Elon Alas M.D.   On: 12/22/2015 23:46     Sigmoid diverticulitis with no free air suspected by Dr. Archie Balboa.  I will move forward with outpatient treatment including a first dose of antibiotics here in the emergency department, pain medication, antiemetics, and a prescription for stool softener both to help with the diverticulitis and reflecting the fact that she has a moderate stool burden.  I discussed all this with the patient and she understands and agrees with the plan   Hinda Kehr, MD 12/23/15 [phone]

## 2015-12-22 NOTE — ED Notes (Signed)
Patient transported to CT 

## 2015-12-22 NOTE — ED Triage Notes (Signed)
Pt to triage via wheelchair.  Pt has diarrhea and abd pain.  Diarrhea x 6   No vomiting.  No back pain   No urinary sx.     Pt alert.

## 2015-12-23 MED ORDER — METRONIDAZOLE 500 MG PO TABS
500.0000 mg | ORAL_TABLET | Freq: Once | ORAL | Status: AC
  Administered 2015-12-23: 500 mg via ORAL
  Filled 2015-12-23: qty 1

## 2015-12-23 MED ORDER — HYDROCODONE-ACETAMINOPHEN 5-325 MG PO TABS
2.0000 | ORAL_TABLET | Freq: Once | ORAL | Status: AC
  Administered 2015-12-23: 2 via ORAL
  Filled 2015-12-23: qty 2

## 2015-12-23 MED ORDER — CIPROFLOXACIN HCL 500 MG PO TABS
500.0000 mg | ORAL_TABLET | ORAL | Status: AC
  Administered 2015-12-23: 500 mg via ORAL
  Filled 2015-12-23: qty 1

## 2015-12-23 MED ORDER — METRONIDAZOLE IN NACL 5-0.79 MG/ML-% IV SOLN
500.0000 mg | Freq: Once | INTRAVENOUS | Status: DC
  Filled 2015-12-23: qty 100

## 2015-12-23 NOTE — ED Notes (Signed)

## 2015-12-23 NOTE — Discharge Instructions (Signed)
We believe your symptoms are caused by diverticulitis.  Most of the time this condition (please read through the included information) can be cured with outpatient antibiotics.  Please take the full course of prescribed medication(s) and follow up with the doctors recommended above.  Return to the ED if your abdominal pain worsens or fails to improve, you develop bloody vomiting, bloody diarrhea, you are unable to tolerate fluids due to vomiting, fever greater than 101, or other symptoms that concern you.  Take Percocet as prescribed for severe pain. Do not drink alcohol, drive or participate in any other potentially dangerous activities while taking this medication as it may make you sleepy. Do not take this medication with any other sedating medications, either prescription or over-the-counter. If you were prescribed Percocet or Vicodin, do not take these with acetaminophen (Tylenol) as it is already contained within these medications.   This medication is an opiate (or narcotic) pain medication and can be habit forming.  Use it as little as possible to achieve adequate pain control.  Do not use or use it with extreme caution if you have a history of opiate abuse or dependence.  If you are on a pain contract with your primary care doctor or a pain specialist, be sure to let them know you were prescribed this medication today from the [facility] Emergency Department.  This medication is intended for your use only - do not give any to anyone else and keep it in a secure place where nobody else, especially children, have access to it.  It will also cause or worsen constipation, so you may want to consider taking an over-the-counter stool softener while you are taking this medication.  Since there was already evidence on the CT scan that you suffer from constipation as well, we strongly recommend you try one or more of the over-the-counter medications before to help soften and move your stools. This  will help you recover from the diverticulitis and give you some relief:  1)  Colace (or Dulcolax) 100 mg:  This is a stool softener, and you may take it once or twice a day as needed. 2)  Senna tablets:  This is a bowel stimulant that will help "push" out your stool. It is the next step to add after you have tried a stool softener. 3)  Miralax (powder):  This medication works by drawing additional fluid into your intestines and helps to flush out your stool.  Mix the powder with water or juice according to label instructions.  It may help if the Colace and Senna are not sufficient, but you must be sure to use the recommended amount of water or juice when you mix up the powder.

## 2016-01-06 DIAGNOSIS — R011 Cardiac murmur, unspecified: Secondary | ICD-10-CM | POA: Diagnosis not present

## 2016-01-06 DIAGNOSIS — R079 Chest pain, unspecified: Secondary | ICD-10-CM | POA: Diagnosis not present

## 2016-01-21 ENCOUNTER — Other Ambulatory Visit: Payer: Self-pay | Admitting: Obstetrics and Gynecology

## 2016-01-27 DIAGNOSIS — R011 Cardiac murmur, unspecified: Secondary | ICD-10-CM | POA: Diagnosis not present

## 2016-01-27 DIAGNOSIS — R079 Chest pain, unspecified: Secondary | ICD-10-CM | POA: Diagnosis not present

## 2016-01-31 DIAGNOSIS — R011 Cardiac murmur, unspecified: Secondary | ICD-10-CM | POA: Diagnosis not present

## 2016-01-31 DIAGNOSIS — I10 Essential (primary) hypertension: Secondary | ICD-10-CM | POA: Diagnosis not present

## 2016-01-31 DIAGNOSIS — R079 Chest pain, unspecified: Secondary | ICD-10-CM | POA: Diagnosis not present

## 2016-01-31 DIAGNOSIS — R002 Palpitations: Secondary | ICD-10-CM | POA: Diagnosis not present

## 2016-03-03 DIAGNOSIS — R300 Dysuria: Secondary | ICD-10-CM | POA: Diagnosis not present

## 2016-04-07 DIAGNOSIS — H40003 Preglaucoma, unspecified, bilateral: Secondary | ICD-10-CM | POA: Diagnosis not present

## 2016-04-11 ENCOUNTER — Other Ambulatory Visit: Payer: Self-pay | Admitting: Obstetrics and Gynecology

## 2016-04-11 DIAGNOSIS — Z1231 Encounter for screening mammogram for malignant neoplasm of breast: Secondary | ICD-10-CM

## 2016-04-11 DIAGNOSIS — Z1272 Encounter for screening for malignant neoplasm of vagina: Secondary | ICD-10-CM | POA: Diagnosis not present

## 2016-04-11 DIAGNOSIS — Z124 Encounter for screening for malignant neoplasm of cervix: Secondary | ICD-10-CM | POA: Diagnosis not present

## 2016-04-11 DIAGNOSIS — Z01419 Encounter for gynecological examination (general) (routine) without abnormal findings: Secondary | ICD-10-CM | POA: Diagnosis not present

## 2016-04-11 DIAGNOSIS — M79671 Pain in right foot: Secondary | ICD-10-CM | POA: Diagnosis not present

## 2016-04-11 DIAGNOSIS — Z1211 Encounter for screening for malignant neoplasm of colon: Secondary | ICD-10-CM | POA: Diagnosis not present

## 2016-04-11 DIAGNOSIS — M25571 Pain in right ankle and joints of right foot: Secondary | ICD-10-CM | POA: Diagnosis not present

## 2016-04-18 ENCOUNTER — Ambulatory Visit: Payer: Commercial Managed Care - HMO | Attending: Obstetrics and Gynecology

## 2016-05-02 DIAGNOSIS — R300 Dysuria: Secondary | ICD-10-CM | POA: Diagnosis not present

## 2016-05-04 DIAGNOSIS — I10 Essential (primary) hypertension: Secondary | ICD-10-CM | POA: Diagnosis not present

## 2016-05-04 DIAGNOSIS — R002 Palpitations: Secondary | ICD-10-CM | POA: Diagnosis not present

## 2016-06-02 ENCOUNTER — Ambulatory Visit
Admission: RE | Admit: 2016-06-02 | Discharge: 2016-06-02 | Disposition: A | Discharge status: Home / Self Care | Payer: Commercial Managed Care - HMO | Source: Ambulatory Visit | Attending: Obstetrics and Gynecology | Admitting: Obstetrics and Gynecology

## 2016-06-02 DIAGNOSIS — Z1231 Encounter for screening mammogram for malignant neoplasm of breast: Secondary | ICD-10-CM | POA: Diagnosis not present

## 2016-08-25 DIAGNOSIS — R002 Palpitations: Secondary | ICD-10-CM | POA: Diagnosis not present

## 2016-08-25 DIAGNOSIS — I340 Nonrheumatic mitral (valve) insufficiency: Secondary | ICD-10-CM | POA: Diagnosis not present

## 2016-08-25 DIAGNOSIS — I10 Essential (primary) hypertension: Secondary | ICD-10-CM | POA: Diagnosis not present

## 2016-10-23 DIAGNOSIS — L309 Dermatitis, unspecified: Secondary | ICD-10-CM | POA: Diagnosis not present

## 2016-11-09 DIAGNOSIS — H40003 Preglaucoma, unspecified, bilateral: Secondary | ICD-10-CM | POA: Diagnosis not present

## 2016-12-19 DIAGNOSIS — H40003 Preglaucoma, unspecified, bilateral: Secondary | ICD-10-CM | POA: Diagnosis not present

## 2016-12-27 DIAGNOSIS — L239 Allergic contact dermatitis, unspecified cause: Secondary | ICD-10-CM | POA: Diagnosis not present

## 2017-02-13 DIAGNOSIS — H15002 Unspecified scleritis, left eye: Secondary | ICD-10-CM | POA: Diagnosis not present

## 2017-03-09 DIAGNOSIS — N390 Urinary tract infection, site not specified: Secondary | ICD-10-CM | POA: Diagnosis not present

## 2017-03-09 DIAGNOSIS — R300 Dysuria: Secondary | ICD-10-CM | POA: Diagnosis not present

## 2017-04-17 DIAGNOSIS — Z1231 Encounter for screening mammogram for malignant neoplasm of breast: Secondary | ICD-10-CM | POA: Diagnosis not present

## 2017-04-17 DIAGNOSIS — Z124 Encounter for screening for malignant neoplasm of cervix: Secondary | ICD-10-CM | POA: Diagnosis not present

## 2017-04-17 DIAGNOSIS — Z23 Encounter for immunization: Secondary | ICD-10-CM | POA: Diagnosis not present

## 2017-04-17 DIAGNOSIS — Z1211 Encounter for screening for malignant neoplasm of colon: Secondary | ICD-10-CM | POA: Diagnosis not present

## 2017-04-18 ENCOUNTER — Other Ambulatory Visit: Payer: Self-pay | Admitting: Obstetrics and Gynecology

## 2017-04-18 DIAGNOSIS — Z1231 Encounter for screening mammogram for malignant neoplasm of breast: Secondary | ICD-10-CM

## 2017-04-24 DIAGNOSIS — R6889 Other general symptoms and signs: Secondary | ICD-10-CM | POA: Diagnosis not present

## 2017-04-24 DIAGNOSIS — J069 Acute upper respiratory infection, unspecified: Secondary | ICD-10-CM | POA: Diagnosis not present

## 2017-05-16 DIAGNOSIS — Z0000 Encounter for general adult medical examination without abnormal findings: Secondary | ICD-10-CM | POA: Diagnosis not present

## 2017-05-16 DIAGNOSIS — I10 Essential (primary) hypertension: Secondary | ICD-10-CM | POA: Diagnosis not present

## 2017-05-16 DIAGNOSIS — Z23 Encounter for immunization: Secondary | ICD-10-CM | POA: Diagnosis not present

## 2017-06-05 ENCOUNTER — Ambulatory Visit
Admission: RE | Admit: 2017-06-05 | Discharge: 2017-06-05 | Disposition: A | Discharge status: Home / Self Care | Payer: Medicare HMO | Source: Ambulatory Visit | Attending: Obstetrics and Gynecology | Admitting: Obstetrics and Gynecology

## 2017-06-05 DIAGNOSIS — Z1231 Encounter for screening mammogram for malignant neoplasm of breast: Secondary | ICD-10-CM

## 2017-06-18 DIAGNOSIS — H26492 Other secondary cataract, left eye: Secondary | ICD-10-CM | POA: Diagnosis not present

## 2017-07-11 DIAGNOSIS — J019 Acute sinusitis, unspecified: Secondary | ICD-10-CM | POA: Diagnosis not present

## 2017-07-11 DIAGNOSIS — R05 Cough: Secondary | ICD-10-CM | POA: Diagnosis not present

## 2017-07-11 DIAGNOSIS — B9689 Other specified bacterial agents as the cause of diseases classified elsewhere: Secondary | ICD-10-CM | POA: Diagnosis not present

## 2017-07-11 DIAGNOSIS — B373 Candidiasis of vulva and vagina: Secondary | ICD-10-CM | POA: Diagnosis not present

## 2017-07-11 DIAGNOSIS — J302 Other seasonal allergic rhinitis: Secondary | ICD-10-CM | POA: Diagnosis not present

## 2017-08-06 DIAGNOSIS — H26492 Other secondary cataract, left eye: Secondary | ICD-10-CM | POA: Diagnosis not present

## 2017-09-23 DIAGNOSIS — B9689 Other specified bacterial agents as the cause of diseases classified elsewhere: Secondary | ICD-10-CM | POA: Diagnosis not present

## 2017-09-23 DIAGNOSIS — J019 Acute sinusitis, unspecified: Secondary | ICD-10-CM | POA: Diagnosis not present

## 2017-12-20 DIAGNOSIS — H40003 Preglaucoma, unspecified, bilateral: Secondary | ICD-10-CM | POA: Diagnosis not present

## 2017-12-27 DIAGNOSIS — H40003 Preglaucoma, unspecified, bilateral: Secondary | ICD-10-CM | POA: Diagnosis not present

## 2018-01-24 DIAGNOSIS — B9689 Other specified bacterial agents as the cause of diseases classified elsewhere: Secondary | ICD-10-CM | POA: Diagnosis not present

## 2018-01-24 DIAGNOSIS — J019 Acute sinusitis, unspecified: Secondary | ICD-10-CM | POA: Diagnosis not present

## 2018-01-28 DIAGNOSIS — L309 Dermatitis, unspecified: Secondary | ICD-10-CM | POA: Diagnosis not present

## 2018-03-25 DIAGNOSIS — J019 Acute sinusitis, unspecified: Secondary | ICD-10-CM | POA: Diagnosis not present

## 2018-05-02 DIAGNOSIS — J019 Acute sinusitis, unspecified: Secondary | ICD-10-CM | POA: Diagnosis not present

## 2018-05-02 DIAGNOSIS — J209 Acute bronchitis, unspecified: Secondary | ICD-10-CM | POA: Diagnosis not present

## 2018-05-02 DIAGNOSIS — B9689 Other specified bacterial agents as the cause of diseases classified elsewhere: Secondary | ICD-10-CM | POA: Diagnosis not present

## 2018-05-02 DIAGNOSIS — R6889 Other general symptoms and signs: Secondary | ICD-10-CM | POA: Diagnosis not present

## 2018-05-13 DIAGNOSIS — R8290 Unspecified abnormal findings in urine: Secondary | ICD-10-CM | POA: Diagnosis not present

## 2018-05-13 DIAGNOSIS — N898 Other specified noninflammatory disorders of vagina: Secondary | ICD-10-CM | POA: Diagnosis not present

## 2018-05-13 DIAGNOSIS — B373 Candidiasis of vulva and vagina: Secondary | ICD-10-CM | POA: Diagnosis not present

## 2018-05-13 DIAGNOSIS — R300 Dysuria: Secondary | ICD-10-CM | POA: Diagnosis not present

## 2018-09-11 DIAGNOSIS — B373 Candidiasis of vulva and vagina: Secondary | ICD-10-CM | POA: Diagnosis not present

## 2018-09-11 DIAGNOSIS — J209 Acute bronchitis, unspecified: Secondary | ICD-10-CM | POA: Diagnosis not present

## 2018-09-11 DIAGNOSIS — Z209 Contact with and (suspected) exposure to unspecified communicable disease: Secondary | ICD-10-CM | POA: Diagnosis not present

## 2018-09-11 DIAGNOSIS — B9689 Other specified bacterial agents as the cause of diseases classified elsewhere: Secondary | ICD-10-CM | POA: Diagnosis not present

## 2018-09-11 DIAGNOSIS — J019 Acute sinusitis, unspecified: Secondary | ICD-10-CM | POA: Diagnosis not present

## 2018-09-11 DIAGNOSIS — Z20828 Contact with and (suspected) exposure to other viral communicable diseases: Secondary | ICD-10-CM | POA: Diagnosis not present

## 2018-10-29 DIAGNOSIS — Z1239 Encounter for other screening for malignant neoplasm of breast: Secondary | ICD-10-CM | POA: Diagnosis not present

## 2018-10-29 DIAGNOSIS — Z1211 Encounter for screening for malignant neoplasm of colon: Secondary | ICD-10-CM | POA: Diagnosis not present

## 2018-10-29 DIAGNOSIS — Z124 Encounter for screening for malignant neoplasm of cervix: Secondary | ICD-10-CM | POA: Diagnosis not present

## 2018-10-30 ENCOUNTER — Other Ambulatory Visit: Payer: Self-pay | Admitting: Obstetrics and Gynecology

## 2018-10-30 DIAGNOSIS — Z1231 Encounter for screening mammogram for malignant neoplasm of breast: Secondary | ICD-10-CM

## 2018-11-19 DIAGNOSIS — J019 Acute sinusitis, unspecified: Secondary | ICD-10-CM | POA: Diagnosis not present

## 2018-11-19 DIAGNOSIS — B9689 Other specified bacterial agents as the cause of diseases classified elsewhere: Secondary | ICD-10-CM | POA: Diagnosis not present

## 2018-11-19 DIAGNOSIS — R05 Cough: Secondary | ICD-10-CM | POA: Diagnosis not present

## 2018-11-26 DIAGNOSIS — H40003 Preglaucoma, unspecified, bilateral: Secondary | ICD-10-CM | POA: Diagnosis not present

## 2019-01-15 ENCOUNTER — Ambulatory Visit
Admission: RE | Admit: 2019-01-15 | Discharge: 2019-01-15 | Disposition: A | Discharge status: Home / Self Care | Payer: Medicare HMO | Source: Ambulatory Visit | Attending: Obstetrics and Gynecology | Admitting: Obstetrics and Gynecology

## 2019-01-15 DIAGNOSIS — Z1231 Encounter for screening mammogram for malignant neoplasm of breast: Secondary | ICD-10-CM | POA: Diagnosis not present

## 2019-01-16 DIAGNOSIS — L509 Urticaria, unspecified: Secondary | ICD-10-CM | POA: Diagnosis not present

## 2019-01-16 DIAGNOSIS — R21 Rash and other nonspecific skin eruption: Secondary | ICD-10-CM | POA: Diagnosis not present

## 2019-02-09 DIAGNOSIS — R0981 Nasal congestion: Secondary | ICD-10-CM | POA: Diagnosis not present

## 2019-02-09 DIAGNOSIS — J029 Acute pharyngitis, unspecified: Secondary | ICD-10-CM | POA: Diagnosis not present

## 2019-02-09 DIAGNOSIS — J0100 Acute maxillary sinusitis, unspecified: Secondary | ICD-10-CM | POA: Diagnosis not present

## 2019-02-09 DIAGNOSIS — Z03818 Encounter for observation for suspected exposure to other biological agents ruled out: Secondary | ICD-10-CM | POA: Diagnosis not present

## 2019-02-24 DIAGNOSIS — J0100 Acute maxillary sinusitis, unspecified: Secondary | ICD-10-CM | POA: Diagnosis not present

## 2019-02-24 DIAGNOSIS — Z03818 Encounter for observation for suspected exposure to other biological agents ruled out: Secondary | ICD-10-CM | POA: Diagnosis not present

## 2019-02-24 DIAGNOSIS — R0981 Nasal congestion: Secondary | ICD-10-CM | POA: Diagnosis not present

## 2019-02-24 DIAGNOSIS — Z20828 Contact with and (suspected) exposure to other viral communicable diseases: Secondary | ICD-10-CM | POA: Diagnosis not present

## 2019-03-12 DIAGNOSIS — M79642 Pain in left hand: Secondary | ICD-10-CM | POA: Diagnosis not present

## 2019-03-12 DIAGNOSIS — M79641 Pain in right hand: Secondary | ICD-10-CM | POA: Diagnosis not present

## 2019-03-12 DIAGNOSIS — I10 Essential (primary) hypertension: Secondary | ICD-10-CM | POA: Diagnosis not present

## 2019-03-12 DIAGNOSIS — Z0000 Encounter for general adult medical examination without abnormal findings: Secondary | ICD-10-CM | POA: Diagnosis not present

## 2019-03-12 DIAGNOSIS — F439 Reaction to severe stress, unspecified: Secondary | ICD-10-CM | POA: Diagnosis not present

## 2019-03-12 DIAGNOSIS — Z87891 Personal history of nicotine dependence: Secondary | ICD-10-CM | POA: Diagnosis not present

## 2019-03-12 DIAGNOSIS — Z23 Encounter for immunization: Secondary | ICD-10-CM | POA: Diagnosis not present

## 2019-04-01 DIAGNOSIS — I10 Essential (primary) hypertension: Secondary | ICD-10-CM | POA: Diagnosis not present

## 2019-04-01 DIAGNOSIS — M79641 Pain in right hand: Secondary | ICD-10-CM | POA: Diagnosis not present

## 2019-04-01 DIAGNOSIS — M79642 Pain in left hand: Secondary | ICD-10-CM | POA: Diagnosis not present

## 2019-04-23 DIAGNOSIS — Z636 Dependent relative needing care at home: Secondary | ICD-10-CM | POA: Diagnosis not present

## 2019-04-23 DIAGNOSIS — Z87891 Personal history of nicotine dependence: Secondary | ICD-10-CM | POA: Diagnosis not present

## 2019-04-23 DIAGNOSIS — Z7289 Other problems related to lifestyle: Secondary | ICD-10-CM | POA: Diagnosis not present

## 2019-04-23 DIAGNOSIS — I10 Essential (primary) hypertension: Secondary | ICD-10-CM | POA: Diagnosis not present

## 2019-05-20 DIAGNOSIS — H15102 Unspecified episcleritis, left eye: Secondary | ICD-10-CM | POA: Diagnosis not present

## 2019-05-27 DIAGNOSIS — H40003 Preglaucoma, unspecified, bilateral: Secondary | ICD-10-CM | POA: Diagnosis not present

## 2019-06-03 DIAGNOSIS — H40003 Preglaucoma, unspecified, bilateral: Secondary | ICD-10-CM | POA: Diagnosis not present

## 2019-07-30 ENCOUNTER — Other Ambulatory Visit: Payer: Self-pay

## 2019-07-30 ENCOUNTER — Ambulatory Visit: Payer: Medicare HMO | Admitting: Dermatology

## 2019-07-30 DIAGNOSIS — L2089 Other atopic dermatitis: Secondary | ICD-10-CM | POA: Diagnosis not present

## 2019-07-30 MED ORDER — CLOBETASOL PROPIONATE 0.05 % EX SOLN: 1.0000 "application " | Freq: Two times a day (BID) | CUTANEOUS | 1 refills | Status: DC

## 2019-07-30 NOTE — Progress Notes (Signed)
   Follow-Up Visit   Subjective  Sharon Watson is a 74 y.o. female who presents for the following: Eczema (mostly on legs and predominately on left leg. Itching wakes her up at night. Usually flares in the fall and clears up as the weather improves. She was treating with Clobetasol/Cerave mix but she is out. She does use Cerave cream daily.).    The following portions of the chart were reviewed this encounter and updated as appropriate:  Tobacco  Allergies  Meds  Problems  Med Hx  Surg Hx  Fam Hx      Review of Systems:  No other skin or systemic complaints except as noted in HPI or Assessment and Plan.  Objective  Well appearing patient in no apparent distress; mood and affect are within normal limits.  A focused examination was performed including arms and legs. Relevant physical exam findings are noted in the Assessment and Plan.  Objective  Right Forearm - Posterior: Excoriations, erythema and scale of arms and legs.   Assessment & Plan  Other atopic dermatitis-severe, but improving with warmer weather.  She tends a flare in the fall Right Forearm - Posterior  Mix Clobetasol solution with 1 pound of Cerave cream and apply to legs and arms qd-bid prn flares.  Discussed Dupixent. May consider if not improving.  clobetasol (TEMOVATE) 0.05 % external solution - Right Forearm - Posterior  Return in about 6 months (around 01/30/2020).   I, Ashok Cordia, CMA, am acting as scribe for Sarina Ser, MD . Documentation: I have reviewed the above documentation for accuracy and completeness, and I agree with the above.  Sarina Ser, MD

## 2019-08-07 ENCOUNTER — Encounter: Payer: Self-pay | Admitting: Dermatology

## 2019-08-16 DIAGNOSIS — J0100 Acute maxillary sinusitis, unspecified: Secondary | ICD-10-CM | POA: Diagnosis not present

## 2019-09-02 DIAGNOSIS — R300 Dysuria: Secondary | ICD-10-CM | POA: Diagnosis not present

## 2019-09-02 DIAGNOSIS — N390 Urinary tract infection, site not specified: Secondary | ICD-10-CM | POA: Diagnosis not present

## 2019-09-02 DIAGNOSIS — R8290 Unspecified abnormal findings in urine: Secondary | ICD-10-CM | POA: Diagnosis not present

## 2019-09-14 DIAGNOSIS — J019 Acute sinusitis, unspecified: Secondary | ICD-10-CM | POA: Diagnosis not present

## 2019-09-14 DIAGNOSIS — B9689 Other specified bacterial agents as the cause of diseases classified elsewhere: Secondary | ICD-10-CM | POA: Diagnosis not present

## 2019-09-14 DIAGNOSIS — B373 Candidiasis of vulva and vagina: Secondary | ICD-10-CM | POA: Diagnosis not present

## 2019-11-17 DIAGNOSIS — Z03818 Encounter for observation for suspected exposure to other biological agents ruled out: Secondary | ICD-10-CM | POA: Diagnosis not present

## 2019-11-17 DIAGNOSIS — J019 Acute sinusitis, unspecified: Secondary | ICD-10-CM | POA: Diagnosis not present

## 2019-11-17 DIAGNOSIS — B373 Candidiasis of vulva and vagina: Secondary | ICD-10-CM | POA: Diagnosis not present

## 2019-11-17 DIAGNOSIS — B9689 Other specified bacterial agents as the cause of diseases classified elsewhere: Secondary | ICD-10-CM | POA: Diagnosis not present

## 2019-12-01 DIAGNOSIS — H40003 Preglaucoma, unspecified, bilateral: Secondary | ICD-10-CM | POA: Diagnosis not present

## 2020-01-13 ENCOUNTER — Other Ambulatory Visit: Payer: Self-pay | Admitting: Obstetrics and Gynecology

## 2020-01-13 DIAGNOSIS — Z1331 Encounter for screening for depression: Secondary | ICD-10-CM | POA: Diagnosis not present

## 2020-01-13 DIAGNOSIS — Z01419 Encounter for gynecological examination (general) (routine) without abnormal findings: Secondary | ICD-10-CM | POA: Diagnosis not present

## 2020-01-13 DIAGNOSIS — M85852 Other specified disorders of bone density and structure, left thigh: Secondary | ICD-10-CM | POA: Diagnosis not present

## 2020-01-13 DIAGNOSIS — Z1231 Encounter for screening mammogram for malignant neoplasm of breast: Secondary | ICD-10-CM

## 2020-01-13 DIAGNOSIS — M85851 Other specified disorders of bone density and structure, right thigh: Secondary | ICD-10-CM | POA: Diagnosis not present

## 2020-01-21 DIAGNOSIS — Z780 Asymptomatic menopausal state: Secondary | ICD-10-CM | POA: Diagnosis not present

## 2020-01-29 ENCOUNTER — Ambulatory Visit: Payer: Medicare HMO | Admitting: Dermatology

## 2020-01-29 ENCOUNTER — Other Ambulatory Visit: Payer: Self-pay

## 2020-01-29 DIAGNOSIS — L853 Xerosis cutis: Secondary | ICD-10-CM | POA: Diagnosis not present

## 2020-01-29 DIAGNOSIS — L821 Other seborrheic keratosis: Secondary | ICD-10-CM | POA: Diagnosis not present

## 2020-01-29 DIAGNOSIS — L820 Inflamed seborrheic keratosis: Secondary | ICD-10-CM | POA: Diagnosis not present

## 2020-01-29 DIAGNOSIS — L209 Atopic dermatitis, unspecified: Secondary | ICD-10-CM

## 2020-01-29 MED ORDER — CLOBETASOL PROPIONATE 0.05 % EX SOLN: CUTANEOUS | 6 refills | Status: DC

## 2020-01-29 NOTE — Progress Notes (Signed)
   Follow-Up Visit   Subjective  Sharon Watson is a 74 y.o. female who presents for the following: Eczema (currently controlled with CeraVe/Clobetasol mix. She hasn't flared in months now) and lesion (on the L shoulder - very itchy, patient would like lesion checked).  There are other spots to be evaluated today.  The following portions of the chart were reviewed this encounter and updated as appropriate:  Tobacco  Allergies  Meds  Problems  Med Hx  Surg Hx  Fam Hx     Review of Systems:  No other skin or systemic complaints except as noted in HPI or Assessment and Plan.  Objective  Well appearing patient in no apparent distress; mood and affect are within normal limits.  A focused examination was performed including the trunk and extremities. Relevant physical exam findings are noted in the Assessment and Plan.  Objective  Trunk, extremities: Clear   Objective  L shoulder x 1: Erythematous keratotic or waxy stuck-on papule or plaque.   Objective  Trunk and extremities: Dry skin  Assessment & Plan  Atopic dermatitis Trunk, extremities Currently controlled with Clobetasol/CeraVe mix -   Continue Clobetasol/CeraVe mix QD-BID PRN. Continue CeraVe cream moisturizer daily.   Atopic dermatitis (eczema) is a chronic, relapsing, pruritic condition that can significantly affect quality of life. It is often associated with allergic rhinitis and/or asthma and can require treatment with topical medications, phototherapy, or in severe cases a biologic medication called Dupixent.    clobetasol (TEMOVATE) 0.05 % external solution - Trunk, extremities  Inflamed seborrheic keratosis L shoulder x 1 With itching  Destruction of lesion - L shoulder x 1 Complexity: simple   Destruction method: cryotherapy   Informed consent: discussed and consent obtained   Timeout:  patient name, date of birth, surgical site, and procedure verified Lesion destroyed using liquid nitrogen: Yes     Region frozen until ice ball extended beyond lesion: Yes   Outcome: patient tolerated procedure well with no complications   Post-procedure details: wound care instructions given    Xerosis cutis Trunk and extremities Continue mild soaps and CeraVe cream moisturizer daily.   Seborrheic Keratoses - Stuck-on, waxy, tan-brown papules and plaques  - Discussed benign etiology and prognosis. - Observe - Call for any changes  Return in about 1 year (around 01/28/2021).  Luther Redo, CMA, am acting as scribe for Sarina Ser, MD .  Documentation: I have reviewed the above documentation for accuracy and completeness, and I agree with the above.  Sarina Ser, MD

## 2020-02-03 ENCOUNTER — Encounter: Payer: Self-pay | Admitting: Dermatology

## 2020-02-19 ENCOUNTER — Ambulatory Visit
Admission: RE | Admit: 2020-02-19 | Discharge: 2020-02-19 | Disposition: A | Discharge status: Home / Self Care | Payer: Medicare HMO | Source: Ambulatory Visit | Attending: Obstetrics and Gynecology | Admitting: Obstetrics and Gynecology

## 2020-02-19 ENCOUNTER — Other Ambulatory Visit: Payer: Self-pay

## 2020-02-19 DIAGNOSIS — Z1231 Encounter for screening mammogram for malignant neoplasm of breast: Secondary | ICD-10-CM | POA: Diagnosis not present

## 2020-04-07 ENCOUNTER — Emergency Department
Admission: EM | Admit: 2020-04-07 | Discharge: 2020-04-07 | Disposition: A | Discharge status: Home / Self Care | Payer: Medicare HMO | Attending: Emergency Medicine | Admitting: Emergency Medicine

## 2020-04-07 ENCOUNTER — Emergency Department: Payer: Medicare HMO

## 2020-04-07 ENCOUNTER — Encounter: Payer: Self-pay | Admitting: Emergency Medicine

## 2020-04-07 ENCOUNTER — Other Ambulatory Visit: Payer: Self-pay

## 2020-04-07 DIAGNOSIS — Z79899 Other long term (current) drug therapy: Secondary | ICD-10-CM | POA: Diagnosis not present

## 2020-04-07 DIAGNOSIS — Z20822 Contact with and (suspected) exposure to covid-19: Secondary | ICD-10-CM | POA: Diagnosis not present

## 2020-04-07 DIAGNOSIS — I10 Essential (primary) hypertension: Secondary | ICD-10-CM | POA: Diagnosis not present

## 2020-04-07 DIAGNOSIS — R0602 Shortness of breath: Secondary | ICD-10-CM | POA: Diagnosis not present

## 2020-04-07 DIAGNOSIS — B9789 Other viral agents as the cause of diseases classified elsewhere: Secondary | ICD-10-CM | POA: Insufficient documentation

## 2020-04-07 DIAGNOSIS — R079 Chest pain, unspecified: Secondary | ICD-10-CM | POA: Diagnosis not present

## 2020-04-07 DIAGNOSIS — J988 Other specified respiratory disorders: Secondary | ICD-10-CM | POA: Insufficient documentation

## 2020-04-07 DIAGNOSIS — R059 Cough, unspecified: Secondary | ICD-10-CM | POA: Diagnosis not present

## 2020-04-07 LAB — CBC WITH DIFFERENTIAL/PLATELET
Abs Immature Granulocytes: 0.02 10*3/uL (ref 0.00–0.07)
Basophils Absolute: 0 10*3/uL (ref 0.0–0.1)
Basophils Relative: 0 %
Eosinophils Absolute: 0.1 10*3/uL (ref 0.0–0.5)
Eosinophils Relative: 1 %
HCT: 36.5 % (ref 36.0–46.0)
Hemoglobin: 12.5 g/dL (ref 12.0–15.0)
Immature Granulocytes: 0 %
Lymphocytes Relative: 34 %
Lymphs Abs: 2.6 10*3/uL (ref 0.7–4.0)
MCH: 32.2 pg (ref 26.0–34.0)
MCHC: 34.2 g/dL (ref 30.0–36.0)
MCV: 94.1 fL (ref 80.0–100.0)
Monocytes Absolute: 0.5 10*3/uL (ref 0.1–1.0)
Monocytes Relative: 7 %
Neutro Abs: 4.3 10*3/uL (ref 1.7–7.7)
Neutrophils Relative %: 58 %
Platelets: 335 10*3/uL (ref 150–400)
RBC: 3.88 MIL/uL (ref 3.87–5.11)
RDW: 12.2 % (ref 11.5–15.5)
WBC: 7.5 10*3/uL (ref 4.0–10.5)
nRBC: 0 % (ref 0.0–0.2)

## 2020-04-07 LAB — COMPREHENSIVE METABOLIC PANEL
ALT: 14 U/L (ref 0–44)
AST: 19 U/L (ref 15–41)
Albumin: 3.7 g/dL (ref 3.5–5.0)
Alkaline Phosphatase: 41 U/L (ref 38–126)
Anion gap: 10 (ref 5–15)
BUN: 26 mg/dL — ABNORMAL HIGH (ref 8–23)
CO2: 25 mmol/L (ref 22–32)
Calcium: 9.8 mg/dL (ref 8.9–10.3)
Chloride: 105 mmol/L (ref 98–111)
Creatinine, Ser: 0.84 mg/dL (ref 0.44–1.00)
GFR, Estimated: >60 mL/min (ref >60)
Glucose, Bld: 91 mg/dL (ref 70–99)
Potassium: 3.9 mmol/L (ref 3.5–5.1)
Sodium: 140 mmol/L (ref 135–145)
Total Bilirubin: 0.4 mg/dL (ref 0.3–1.2)
Total Protein: 6.8 g/dL (ref 6.5–8.1)

## 2020-04-07 LAB — RESP PANEL BY RT-PCR (FLU A&B, COVID) ARPGX2
Influenza A by PCR: NEGATIVE
Influenza B by PCR: NEGATIVE
SARS Coronavirus 2 by RT PCR: NEGATIVE

## 2020-04-07 MED ORDER — PREDNISONE 50 MG PO TABS: 50.0000 mg | ORAL_TABLET | Freq: Every day | ORAL | 0 refills | Status: DC

## 2020-04-07 MED ORDER — IOHEXOL 350 MG/ML SOLN
75.0000 mL | Freq: Once | INTRAVENOUS | Status: AC | PRN
  Administered 2020-04-07: 75 mL via INTRAVENOUS
  Filled 2020-04-07: qty 75

## 2020-04-07 MED ORDER — PSEUDOEPH-BROMPHEN-DM 30-2-10 MG/5ML PO SYRP: 10.0000 mL | ORAL_SOLUTION | Freq: Four times a day (QID) | ORAL | 0 refills | Status: DC | PRN

## 2020-04-07 MED ORDER — FLUTICASONE PROPIONATE 50 MCG/ACT NA SUSP: 1.0000 | Freq: Two times a day (BID) | NASAL | 0 refills | Status: DC

## 2020-04-07 NOTE — ED Provider Notes (Signed)
[facility] Provider Note  ____________________________________________  Time seen: Approximately 3:42 PM  I have reviewed the triage vital signs and the nursing notes.   HISTORY  Chief Complaint Nasal Congestion, Back Pain, and Cough    HPI Sharon Watson is a 75 y.o. female who presents the emergency department complaining of nasal congestion, sinus pressure, headache, shortness of breath, posterior chest pain, fatigue.  Patient states that symptoms began roughly 4 to 5 days ago.  Patient is not having any substernal pain but states that she is having pain in her posterior lung region.  This is not pleuritic in nature and is constant.  Not worse with movement.  Patient states that initially she thought she had a sinus infection until symptoms  start progressing to her chest.  Patient does have a history of provoked PE after foot surgery.  Patient was on 3 months of anticoagulation but has since been off.  Patient is having shortness of breath but this is with activity not at rest.  No medications prior to arrival.  Patient is vaccinated for flu and COVID.  She is also received pneumonia vaccine in the past.        Past Medical History:  Diagnosis Date  . Hypertension     There are no problems to display for this patient.   Past Surgical History:  Procedure Laterality Date  . ABDOMINAL HYSTERECTOMY    . FOOT SURGERY      Prior to Admission medications   Medication Sig Start Date End Date Taking? Authorizing Provider  brompheniramine-pseudoephedrine-DM 30-2-10 MG/5ML syrup Take 10 mLs by mouth 4 (four) times daily as needed. 04/07/20  Yes Yousaf Sainato, Charline Bills, PA-C  fluticasone (FLONASE) 50 MCG/ACT nasal spray Place 1 spray into both nostrils 2 (two) times daily. 04/07/20  Yes Jerris Fleer, Charline Bills, PA-C  predniSONE (DELTASONE) 50 MG tablet Take 1 tablet (50 mg total) by mouth daily with breakfast. 04/07/20  Yes Mellody Masri, Charline Bills,  PA-C  Calcium Carbonate-Vitamin D 600-400 MG-UNIT tablet Take by mouth.    [provider]  clobetasol (TEMOVATE) 0.05 % external solution Apply 1 application topically 2 (two) times daily. Prn flares 07/30/19   Ralene Bathe, MD  clobetasol (TEMOVATE) 0.05 % external solution Mix into CeraVe cream as directed. Apply to aa's rash QD-BID PRN flares. 01/29/20   Ralene Bathe, MD  cyanocobalamin 1000 MCG tablet Take by mouth.    [provider]  docusate sodium (COLACE) 100 MG capsule Take 1 tablet once or twice daily as needed for constipation 12/22/15   Hinda Kehr, MD  estradiol (ESTRACE) 0.5 MG tablet TAKE 1 TABLET EVERY DAY (NEED MD APPOINTMENT) 11/18/19   [provider]  HYDROcodone-acetaminophen (NORCO/VICODIN) 5-325 MG tablet Take 1-2 tablets by mouth every 4 (four) hours as needed for moderate pain. 12/22/15   Hinda Kehr, MD  lisinopril-hydrochlorothiazide (ZESTORETIC) 10-12.5 MG tablet Take 1 tablet by mouth daily. 11/18/19   [provider]  loratadine-pseudoephedrine (CLARITIN-D 24-HOUR) 10-240 MG 24 hr tablet Take 1 tablet by mouth daily.    [provider]    Allergies Patient has no known allergies.  Family History  Problem Relation Age of Onset  . Breast cancer Neg Hx     Social History Social History   Tobacco Use  . Smoking status: Never Smoker  . Smokeless tobacco: Never Used  Substance Use Topics  . Alcohol use: Yes     Review of Systems  Constitutional: No fever/chills Eyes:  No visual changes. No discharge ENT: Positive for nasal congestion and sinus pressure Cardiovascular: no chest pain. Respiratory: Positive cough.  Positive for exertional shortness of breath Gastrointestinal: No abdominal pain.  No nausea, no vomiting.  No diarrhea.  No constipation. Musculoskeletal: Negative for musculoskeletal pain. Skin: Negative for rash, abrasions, lacerations, ecchymosis. Neurological: Positive for headache but  denies focal weakness or numbness.  10 System ROS otherwise negative.  ____________________________________________   PHYSICAL EXAM:  VITAL SIGNS: ED Triage Vitals  Enc Vitals Group     BP 04/07/20 1522 (!) 142/81     Pulse Rate 04/07/20 1522 69     Resp 04/07/20 1522 17     Temp 04/07/20 1522 98 F (36.7 C)     Temp Source 04/07/20 1522 Oral     SpO2 04/07/20 1522 96 %     Weight 04/07/20 1520 156 lb (70.8 kg)     Height 04/07/20 1520 5\' 5"  (1.651 m)     Head Circumference --      Peak Flow --      Pain Score 04/07/20 1519 3     Pain Loc --      Pain Edu? --      Excl. in [place]? --      Constitutional: Alert and oriented. Well appearing and in no acute distress. Eyes: Conjunctivae are normal. PERRL. EOMI. Head: Atraumatic. ENT:      Ears:       Nose: No congestion/rhinnorhea.      Mouth/Throat: Mucous membranes are moist.  Neck: No stridor.   Hematological/Lymphatic/Immunilogical: No cervical lymphadenopathy. Cardiovascular: Normal rate, regular rhythm. Normal S1 and S2.  Good peripheral circulation. Respiratory: Normal respiratory effort without tachypnea or retractions. Lungs CTAB. Good air entry to the bases with no decreased or absent breath sounds. Gastrointestinal: Bowel sounds 4 quadrants. Soft and nontender to palpation. No guarding or rigidity. No palpable masses. No distention. Musculoskeletal: Full range of motion to all extremities. No gross deformities appreciated. Neurologic:  Normal speech and language. No gross focal neurologic deficits are appreciated.  Skin:  Skin is warm, dry and intact. No rash noted. Psychiatric: Mood and affect are normal. Speech and behavior are normal. Patient exhibits appropriate insight and judgement.   ____________________________________________   LABS (all labs ordered are listed, but only abnormal results are displayed)  Labs Reviewed  COMPREHENSIVE METABOLIC PANEL - Abnormal; Notable for the following components:       Result Value   BUN 26 (*)    All other components within normal limits  RESP PANEL BY RT-PCR (FLU A&B, COVID) ARPGX2  CBC WITH DIFFERENTIAL/PLATELET   ____________________________________________  EKG   ____________________________________________  RADIOLOGY I personally viewed and evaluated these images as part of my medical decision making, as well as reviewing the written report by the radiologist.  ED Provider Interpretation: Concur with radiologist finding of no consolidation on chest x-ray concerning for pneumonia.  No evidence of PE PE chest.  DG Chest 2 View  Result Date: 04/07/2020 CLINICAL DATA:  Shortness of breath, cough, nasal drainage, upper left back pain. EXAM: CHEST - 2 VIEW COMPARISON:  Chest x-ray 05/13/2012 FINDINGS: The heart size and mediastinal contours are within normal limits. No focal consolidation. No pulmonary edema. No pleural effusion. No pneumothorax. No acute osseous abnormality. Multilevel degenerative changes of the spine. Atherosclerotic plaque of the abdominal aorta. IMPRESSION: 1. No active cardiopulmonary disease. 2.  Aortic Atherosclerosis (ICD10-I70.0). Electronically Signed   By: Iven Finn M.D.   On: 04/07/2020  16:16   CT Angio Chest PE W and/or Wo Contrast  Result Date: 04/07/2020 CLINICAL DATA:  Shortness of breath and left-sided chest pain for 1 week, initial encounter EXAM: CT ANGIOGRAPHY CHEST WITH CONTRAST TECHNIQUE: Multidetector CT imaging of the chest was performed using the standard protocol during bolus administration of intravenous contrast. Multiplanar CT image reconstructions and MIPs were obtained to evaluate the vascular anatomy. CONTRAST:  51mL OMNIPAQUE IOHEXOL 350 MG/ML SOLN COMPARISON:  Chest x-ray from earlier in the same day, CT from 12/22/2015. FINDINGS: Cardiovascular: Thoracic aorta demonstrates atherosclerotic calcifications without aneurysmal dilatation or dissection. No coronary calcifications are seen. No cardiac  enlargement is noted. The pulmonary artery shows a normal branching pattern. No filling defect to suggest pulmonary embolism is identified. Mediastinum/Nodes: Thoracic inlet is within normal limits. No sizable hilar or mediastinal adenopathy is noted. The esophagus as visualized is within normal limits. Lungs/Pleura: Lungs are well aerated without focal infiltrate or sizable effusion. No pneumothorax is seen. Upper Abdomen: Visualized upper abdomen demonstrates cystic changes in the upper pole of the left kidney as well as in the posterior aspect of the right lobe of the liver. The remainder of the upper abdomen is within normal limits. Musculoskeletal: Degenerative changes of the thoracic spine are noted. No acute abnormality is noted. Review of the MIP images confirms the above findings. IMPRESSION: No evidence of pulmonary emboli. No acute abnormality noted. Cystic change in the liver and left kidney stable from prior CT from 2017. Aortic Atherosclerosis (ICD10-I70.0). Electronically Signed   By: Inez Catalina M.D.   On: 04/07/2020 18:05    ____________________________________________    PROCEDURES  Procedure(s) performed:    Procedures    Medications  iohexol (OMNIPAQUE) 350 MG/ML injection 75 mL (75 mLs Intravenous Contrast Given 04/07/20 1727)     ____________________________________________   INITIAL IMPRESSION / ASSESSMENT AND PLAN / ED COURSE  Pertinent labs & imaging results that were available during my care of the patient were reviewed by me and considered in my medical decision making (see chart for details).  Review of the [place] CSRS was performed in accordance of the [place] prior to dispensing any controlled drugs.           Patient's diagnosis is consistent with viral respiratory illness.  Patient presented with multiple viral symptoms over the past 5 days.  Patient had developed some pain in the posterior left lung region.  There is no substernal pain.  Overall exam was  reassuring without acute finding.  Patient did have a provoked PE after surgery but has not been on anticoagulation.  Given the symptoms, patient was evaluated for COVID, influenza, viral respiratory illness, bronchitis, pneumonia, PE.  Results with patient's labs are reassuring.  Negative for COVID and influenza.  No evidence of pneumonia on chest x-ray or CT.  No PE on CT scan either.  At this time patient's symptoms are consistent with viral illness and she will be prescribed symptom control medications.  Follow-up with primary care as needed.  Return precautions discussed with the patient. Patient is given ED precautions to return to the ED for any worsening or new symptoms.     ____________________________________________  FINAL CLINICAL IMPRESSION(S) / ED DIAGNOSES  Final diagnoses:  Viral respiratory illness      NEW MEDICATIONS STARTED DURING THIS VISIT:  ED Discharge Orders         Ordered    predniSONE (DELTASONE) 50 MG tablet  Daily with breakfast        04/07/20  1845    brompheniramine-pseudoephedrine-DM 30-2-10 MG/5ML syrup  4 times daily PRN        04/07/20 1845    fluticasone (FLONASE) 50 MCG/ACT nasal spray  2 times daily        04/07/20 1845              This chart was dictated using voice recognition software/Dragon. Despite best efforts to proofread, errors can occur which can change the meaning. Any change was purely unintentional.    Darletta Moll, PA-C 04/07/20 1848    Nena Polio, MD 04/08/20 Laureen Abrahams

## 2020-04-07 NOTE — ED Triage Notes (Signed)
Pt comes into the ED via POV c/o cough, nasal drainage, and upper left back pain behind her lung.  Pt has been dealing with these symptoms x 1 week.  Pt ambulatory to triage at this time and in NAd with even and unlabored respirations.  Pt denies any COPD, asthma, or heart failure.  Pt concerned she may have some pneumonia on the left side where she was having the back lung discomfort.

## 2020-05-15 ENCOUNTER — Emergency Department
Admission: EM | Admit: 2020-05-15 | Discharge: 2020-05-15 | Disposition: A | Discharge status: Home / Self Care | Payer: Medicare HMO | Attending: Emergency Medicine | Admitting: Emergency Medicine

## 2020-05-15 ENCOUNTER — Other Ambulatory Visit: Payer: Self-pay

## 2020-05-15 ENCOUNTER — Emergency Department: Payer: Medicare HMO

## 2020-05-15 DIAGNOSIS — M545 Low back pain, unspecified: Secondary | ICD-10-CM | POA: Diagnosis not present

## 2020-05-15 DIAGNOSIS — Z79899 Other long term (current) drug therapy: Secondary | ICD-10-CM | POA: Diagnosis not present

## 2020-05-15 DIAGNOSIS — S39012A Strain of muscle, fascia and tendon of lower back, initial encounter: Secondary | ICD-10-CM | POA: Insufficient documentation

## 2020-05-15 DIAGNOSIS — S3992XA Unspecified injury of lower back, initial encounter: Secondary | ICD-10-CM | POA: Diagnosis present

## 2020-05-15 DIAGNOSIS — M549 Dorsalgia, unspecified: Secondary | ICD-10-CM | POA: Diagnosis not present

## 2020-05-15 DIAGNOSIS — X500XXA Overexertion from strenuous movement or load, initial encounter: Secondary | ICD-10-CM | POA: Diagnosis not present

## 2020-05-15 DIAGNOSIS — I10 Essential (primary) hypertension: Secondary | ICD-10-CM | POA: Diagnosis not present

## 2020-05-15 MED ORDER — HYDROCODONE-ACETAMINOPHEN 5-325 MG PO TABS: 1.0000 | ORAL_TABLET | ORAL | 0 refills | Status: DC | PRN

## 2020-05-15 MED ORDER — METHOCARBAMOL 500 MG PO TABS: 500.0000 mg | ORAL_TABLET | Freq: Four times a day (QID) | ORAL | 0 refills | Status: DC

## 2020-05-15 MED ORDER — OXYCODONE-ACETAMINOPHEN 5-325 MG PO TABS
1.0000 | ORAL_TABLET | Freq: Once | ORAL | Status: AC
  Administered 2020-05-15: 1 via ORAL
  Filled 2020-05-15: qty 1

## 2020-05-15 MED ORDER — PREDNISONE 10 MG PO TABS: 10.0000 mg | ORAL_TABLET | Freq: Every day | ORAL | 0 refills | Status: DC

## 2020-05-15 NOTE — ED Notes (Signed)
First RN note: pt to ED via [facility] with c/o R lower back pain that started several days ago, per EMS pt took Ibuprofen yesterday, has not taken any pain medication today. Per EMS pt with hx of sciatica, reports that patient reports laying on L side relieves pain. Per EMS pt denies any falls/known injury/trauma.   116/87 66 97% RA

## 2020-05-15 NOTE — ED Notes (Signed)
Patient transported to X-ray 

## 2020-05-15 NOTE — ED Notes (Signed)
Pt wheeled to bathroom, pt able to transfer to wheelchair and toilet with no assist.

## 2020-05-15 NOTE — ED Provider Notes (Signed)
[facility] Emergency Department Provider Note  ____________________________________________  Time seen: Approximately 12:26 PM  I have reviewed the triage vital signs and the nursing notes.   HISTORY  Chief Complaint Back Pain    HPI Sharon Watson is a 75 y.o. female who presents the emergency department complaining of right-sided lower back pain.  Patient states that she has a history of sciatica.  2 days ago she was transferring a load to close from the washer to the dryer when she felt a sudden sharp pain in her back.  Patient states that initially she has some very mild pain along the right side of her lower back with no radiation into her leg.  Patient has had no bowel or bladder dysfunction, saddle anesthesia or paresthesias.  No urinary or GI complaints.  She states that she has had progressive pain in the right back at this time.  Again there is no radiation of the pain.  She states that typically her sciatica includes pain running down one of her legs.  She states that again the pain is becoming worse to the point that she does not feel like she can ambulate as normal.  She is having no weakness however just pain.         Past Medical History:  Diagnosis Date  . Hypertension     There are no problems to display for this patient.   Past Surgical History:  Procedure Laterality Date  . ABDOMINAL HYSTERECTOMY    . FOOT SURGERY      Prior to Admission medications   Medication Sig Start Date End Date Taking? Authorizing Provider  HYDROcodone-acetaminophen (NORCO/VICODIN) 5-325 MG tablet Take 1 tablet by mouth every 4 (four) hours as needed for moderate pain. 05/15/20  Yes Cuthriell, Charline Bills, PA-C  methocarbamol (ROBAXIN) 500 MG tablet Take 1 tablet (500 mg total) by mouth 4 (four) times daily. 05/15/20  Yes Cuthriell, Charline Bills, PA-C  predniSONE (DELTASONE) 10 MG tablet Take 1 tablet (10 mg total) by mouth daily. 05/15/20  Yes Cuthriell, Charline Bills,  PA-C  brompheniramine-pseudoephedrine-DM 30-2-10 MG/5ML syrup Take 10 mLs by mouth 4 (four) times daily as needed. 04/07/20   Cuthriell, Charline Bills, PA-C  Calcium Carbonate-Vitamin D 600-400 MG-UNIT tablet Take by mouth.    [provider]  clobetasol (TEMOVATE) 0.05 % external solution Apply 1 application topically 2 (two) times daily. Prn flares 07/30/19   Ralene Bathe, MD  clobetasol (TEMOVATE) 0.05 % external solution Mix into CeraVe cream as directed. Apply to aa's rash QD-BID PRN flares. 01/29/20   Ralene Bathe, MD  cyanocobalamin 1000 MCG tablet Take by mouth.    [provider]  docusate sodium (COLACE) 100 MG capsule Take 1 tablet once or twice daily as needed for constipation 12/22/15   Hinda Kehr, MD  estradiol (ESTRACE) 0.5 MG tablet TAKE 1 TABLET EVERY DAY (NEED MD APPOINTMENT) 11/18/19   [provider]  fluticasone (FLONASE) 50 MCG/ACT nasal spray Place 1 spray into both nostrils 2 (two) times daily. 04/07/20   Cuthriell, Charline Bills, PA-C  HYDROcodone-acetaminophen (NORCO/VICODIN) 5-325 MG tablet Take 1-2 tablets by mouth every 4 (four) hours as needed for moderate pain. 12/22/15   Hinda Kehr, MD  lisinopril-hydrochlorothiazide (ZESTORETIC) 10-12.5 MG tablet Take 1 tablet by mouth daily. 11/18/19   [provider]  loratadine-pseudoephedrine (CLARITIN-D 24-HOUR) 10-240 MG 24 hr tablet Take 1 tablet by mouth daily.    [provider]  predniSONE (DELTASONE) 50 MG tablet Take  1 tablet (50 mg total) by mouth daily with breakfast. 04/07/20   Cuthriell, Charline Bills, PA-C    Allergies Patient has no known allergies.  Family History  Problem Relation Age of Onset  . Breast cancer Neg Hx     Social History Social History   Tobacco Use  . Smoking status: Never Smoker  . Smokeless tobacco: Never Used  Substance Use Topics  . Alcohol use: Yes     Review of Systems  Constitutional: No fever/chills Eyes: No visual changes. No  discharge ENT: No upper respiratory complaints. Cardiovascular: no chest pain. Respiratory: no cough. No SOB. Gastrointestinal: No abdominal pain.  No nausea, no vomiting.  No diarrhea.  No constipation. Musculoskeletal: Positive for right-sided lower back pain Skin: Negative for rash, abrasions, lacerations, ecchymosis. Neurological: Negative for headaches, focal weakness or numbness.  10 System ROS otherwise negative.  ____________________________________________   PHYSICAL EXAM:  VITAL SIGNS: ED Triage Vitals [05/15/20 1208]  Enc Vitals Group     BP 133/64     Pulse Rate 62     Resp 18     Temp 97.7 F (36.5 C)     Temp Source Oral     SpO2 100 %     Weight 157 lb (71.2 kg)     Height 5\' 5"  (1.651 m)     Head Circumference      Peak Flow      Pain Score 10     Pain Loc      Pain Edu?      Excl. in [place]?      Constitutional: Alert and oriented. Well appearing and in no acute distress. Eyes: Conjunctivae are normal. PERRL. EOMI. Head: Atraumatic. ENT:      Ears:       Nose: No congestion/rhinnorhea.      Mouth/Throat: Mucous membranes are moist.  Neck: No stridor.    Cardiovascular: Normal rate, regular rhythm. Normal S1 and S2.  Good peripheral circulation. Respiratory: Normal respiratory effort without tachypnea or retractions. Lungs CTAB. Good air entry to the bases with no decreased or absent breath sounds. Gastrointestinal: Bowel sounds 4 quadrants. Soft and nontender to palpation. No guarding or rigidity. No palpable masses. No distention. No CVA tenderness. Musculoskeletal: Full range of motion to all extremities. No gross deformities appreciated.  Visualization of the lumbar spine reveals no visible signs of trauma.  No abrasions, lacerations, ecchymosis.  No deformity.  Patient has limited range of motion with flexion, extension and rotation due to pain.  No midline or left-sided tenderness to palpation.  Patient is nontender in the right SI joint or sciatic  notch.  She has diffuse tenderness along the right paraspinal muscle group extending from L2-L5 range.  Palpable spasms are appreciated in this range.  Results pedis pulse intact bilateral lower extremities.  Sensation intact and equal in all dermatomal distributions bilateral lower extremities. Neurologic:  Normal speech and language. No gross focal neurologic deficits are appreciated.  Skin:  Skin is warm, dry and intact. No rash noted. Psychiatric: Mood and affect are normal. Speech and behavior are normal. Patient exhibits appropriate insight and judgement.   ____________________________________________   LABS (all labs ordered are listed, but only abnormal results are displayed)  Labs Reviewed - No data to display ____________________________________________  EKG   ____________________________________________  RADIOLOGY I personally viewed and evaluated these images as part of my medical decision making, as well as reviewing the written report by the radiologist.  ED Provider Interpretation: Concur with  radiologist finding of multilevel disc disease but no acute findings.  DG Lumbar Spine 2-3 Views  Result Date: 05/15/2020 CLINICAL DATA:  RIGHT-sided back pain EXAM: LUMBAR SPINE - 2-3 VIEW COMPARISON:  March 25, 2014 FINDINGS: There are five non-rib bearing lumbar-type vertebral bodies. There is normal alignment. There is no evidence for acute fracture or subluxation. Severe intervertebral disc space height loss of L4-5. Moderate to severe intervertebral disc space height loss of L5-S1. Mild intervertebral disc space height loss of L2-3. Mild multilevel endplate proliferative changes. Lower lumbar facet arthropathy atherosclerotic calcifications of the aorta. IMPRESSION: Multilevel degenerative disc disease of the lumbar spine, most severe at L4-5. Aortic Atherosclerosis (ICD10-I70.0). Electronically Signed   By: Valentino Saxon MD   On: 05/15/2020 14:10     ____________________________________________    PROCEDURES  Procedure(s) performed:    Procedures    Medications  oxyCODONE-acetaminophen (PERCOCET/ROXICET) 5-325 MG per tablet 1 tablet (1 tablet Oral Given 05/15/20 1323)     ____________________________________________   INITIAL IMPRESSION / ASSESSMENT AND PLAN / ED COURSE  Pertinent labs & imaging results that were available during my care of the patient were reviewed by me and considered in my medical decision making (see chart for details).  Review of the [place] CSRS was performed in accordance of the [place] prior to dispensing any controlled drugs.           Patient's diagnosis is consistent with lumbar strain.  Patient presents emergency department complaining of right lower back pain.  Findings on imaging were reassuring with multilevel degenerative changes without acute findings.  At this time patient is neurologically intact with no concerning symptoms.  Patiently placed on prednisone taper, muscle relaxer, pain medications at home.  Refer to neurosurgery.  Return precautions discussed with the patient and her husband..  Patient is given ED precautions to return to the ED for any worsening or new symptoms.     ____________________________________________  FINAL CLINICAL IMPRESSION(S) / ED DIAGNOSES  Final diagnoses:  Strain of lumbar region, initial encounter      NEW MEDICATIONS STARTED DURING THIS VISIT:  ED Discharge Orders         Ordered    predniSONE (DELTASONE) 10 MG tablet  Daily       Note to Pharmacy: Take 6 pills x 2 days, 5 pills x 2 days, 4 pills x 2 days, 3 pills x 2 days, 2 pills x 2 days, and 1 pill x 2 days   05/15/20 1512    methocarbamol (ROBAXIN) 500 MG tablet  4 times daily        05/15/20 1512    HYDROcodone-acetaminophen (NORCO/VICODIN) 5-325 MG tablet  Every 4 hours PRN        05/15/20 1512              This chart was dictated using voice recognition software/Dragon.  Despite best efforts to proofread, errors can occur which can change the meaning. Any change was purely unintentional.    Darletta Moll, PA-C 05/15/20 1515    Harvest Dark, MD 05/15/20 1550

## 2020-05-15 NOTE — ED Notes (Signed)
Pt with right sided lower back/flank pain x 3 days. Pt states she has sciatica, but this pain has gotten worse. Pt denies blood in urine, and denies frequency and burning with urination.

## 2020-05-15 NOTE — ED Triage Notes (Signed)
Pt states she has had RL back pain since yesterday- pt states it has gotten progressively worse- pt states that she has a hx of sciatica but this feels different because it does not go down her leg

## 2020-05-26 DIAGNOSIS — M5136 Other intervertebral disc degeneration, lumbar region: Secondary | ICD-10-CM | POA: Diagnosis not present

## 2020-06-07 DIAGNOSIS — H40003 Preglaucoma, unspecified, bilateral: Secondary | ICD-10-CM | POA: Diagnosis not present

## 2020-06-08 DIAGNOSIS — H40003 Preglaucoma, unspecified, bilateral: Secondary | ICD-10-CM | POA: Diagnosis not present

## 2020-06-12 ENCOUNTER — Other Ambulatory Visit: Payer: Self-pay

## 2020-06-12 ENCOUNTER — Emergency Department
Admission: EM | Admit: 2020-06-12 | Discharge: 2020-06-12 | Disposition: A | Discharge status: Home / Self Care | Payer: Medicare HMO | Attending: Emergency Medicine | Admitting: Emergency Medicine

## 2020-06-12 ENCOUNTER — Emergency Department: Payer: Medicare HMO

## 2020-06-12 DIAGNOSIS — I10 Essential (primary) hypertension: Secondary | ICD-10-CM | POA: Insufficient documentation

## 2020-06-12 DIAGNOSIS — Z79899 Other long term (current) drug therapy: Secondary | ICD-10-CM | POA: Diagnosis not present

## 2020-06-12 DIAGNOSIS — R1031 Right lower quadrant pain: Secondary | ICD-10-CM | POA: Insufficient documentation

## 2020-06-12 DIAGNOSIS — R102 Pelvic and perineal pain: Secondary | ICD-10-CM | POA: Diagnosis not present

## 2020-06-12 DIAGNOSIS — R109 Unspecified abdominal pain: Secondary | ICD-10-CM | POA: Diagnosis not present

## 2020-06-12 DIAGNOSIS — M47816 Spondylosis without myelopathy or radiculopathy, lumbar region: Secondary | ICD-10-CM | POA: Diagnosis not present

## 2020-06-12 DIAGNOSIS — I700 Atherosclerosis of aorta: Secondary | ICD-10-CM | POA: Diagnosis not present

## 2020-06-12 LAB — URINALYSIS, COMPLETE (UACMP) WITH MICROSCOPIC
Bilirubin Urine: NEGATIVE
Glucose, UA: NEGATIVE mg/dL
Hgb urine dipstick: NEGATIVE
Ketones, ur: NEGATIVE mg/dL
Leukocytes,Ua: NEGATIVE
Nitrite: NEGATIVE
Protein, ur: NEGATIVE mg/dL
Specific Gravity, Urine: 1.018 (ref 1.005–1.030)
WBC, UA: NONE SEEN WBC/hpf (ref 0–5)
pH: 7 (ref 5.0–8.0)

## 2020-06-12 LAB — COMPREHENSIVE METABOLIC PANEL
ALT: 12 U/L (ref 0–44)
AST: 16 U/L (ref 15–41)
Albumin: 3.8 g/dL (ref 3.5–5.0)
Alkaline Phosphatase: 39 U/L (ref 38–126)
Anion gap: 6 (ref 5–15)
BUN: 29 mg/dL — ABNORMAL HIGH (ref 8–23)
CO2: 25 mmol/L (ref 22–32)
Calcium: 9.3 mg/dL (ref 8.9–10.3)
Chloride: 106 mmol/L (ref 98–111)
Creatinine, Ser: 0.82 mg/dL (ref 0.44–1.00)
GFR, Estimated: >60 mL/min (ref >60)
Glucose, Bld: 99 mg/dL (ref 70–99)
Potassium: 4.4 mmol/L (ref 3.5–5.1)
Sodium: 137 mmol/L (ref 135–145)
Total Bilirubin: 0.6 mg/dL (ref 0.3–1.2)
Total Protein: 6.6 g/dL (ref 6.5–8.1)

## 2020-06-12 LAB — CBC
HCT: 36.7 % (ref 36.0–46.0)
Hemoglobin: 12.4 g/dL (ref 12.0–15.0)
MCH: 32.5 pg (ref 26.0–34.0)
MCHC: 33.8 g/dL (ref 30.0–36.0)
MCV: 96.3 fL (ref 80.0–100.0)
Platelets: 406 10*3/uL — ABNORMAL HIGH (ref 150–400)
RBC: 3.81 MIL/uL — ABNORMAL LOW (ref 3.87–5.11)
RDW: 12.9 % (ref 11.5–15.5)
WBC: 7.5 10*3/uL (ref 4.0–10.5)
nRBC: 0 % (ref 0.0–0.2)

## 2020-06-12 LAB — LIPASE, BLOOD: Lipase: 25 U/L (ref 11–51)

## 2020-06-12 MED ORDER — ACETAMINOPHEN 500 MG PO TABS
1000.0000 mg | ORAL_TABLET | Freq: Once | ORAL | Status: AC
  Administered 2020-06-12: 1000 mg via ORAL
  Filled 2020-06-12: qty 2

## 2020-06-12 MED ORDER — MORPHINE SULFATE (PF) 4 MG/ML IV SOLN
4.0000 mg | Freq: Once | INTRAVENOUS | Status: AC
  Administered 2020-06-12: 4 mg via INTRAVENOUS
  Filled 2020-06-12: qty 1

## 2020-06-12 MED ORDER — LACTATED RINGERS IV BOLUS
1000.0000 mL | Freq: Once | INTRAVENOUS | Status: AC
  Administered 2020-06-12: 1000 mL via INTRAVENOUS

## 2020-06-12 MED ORDER — IOHEXOL 300 MG/ML  SOLN
100.0000 mL | Freq: Once | INTRAMUSCULAR | Status: AC | PRN
  Administered 2020-06-12: 100 mL via INTRAVENOUS

## 2020-06-12 NOTE — ED Notes (Signed)
Pt given apple sauce , and [place] .

## 2020-06-12 NOTE — ED Provider Notes (Signed)
[facility] Emergency Department Provider Note ____________________________________________   Event Date/Time   First MD Initiated Contact with Patient 06/12/20 1320     (approximate)  I have reviewed the triage vital signs and the nursing notes.  HISTORY  Chief Complaint Abdominal Pain   HPI Sharon Watson is a 75 y.o. femalewho presents to the ED for evaluation of abd pain.   Chart review indicates hx HTN.  Surgical abdominal history of remote total abdominal hysterectomy.  Patient presents to the ED with her husband for evaluation of 4 days of progressively worsening RLQ abdominal pain.  Patient reports intermittent and aching pain to the RLQ but initially was rather mild for couple days, but acutely worsening for the past 36 hours.  She reports occasionally the pain radiating to her lumbar back, but denies any radiation to her groin.  She reports up to 9/10 intensity sharp pain.  Beyond the pain, she denies any associated symptoms.  Denies nausea, emesis, diarrhea or stool changes, vaginal discharge or bleeding, fever, trauma, syncope, shortness of breath or chest pain.  Denies any inguinal bulges on the right or history of hernias.   Past Medical History:  Diagnosis Date  . Hypertension     There are no problems to display for this patient.   Past Surgical History:  Procedure Laterality Date  . ABDOMINAL HYSTERECTOMY    . FOOT SURGERY      Prior to Admission medications   Medication Sig Start Date End Date Taking? Authorizing Provider  brompheniramine-pseudoephedrine-DM 30-2-10 MG/5ML syrup Take 10 mLs by mouth 4 (four) times daily as needed. 04/07/20   Cuthriell, Charline Bills, PA-C  Calcium Carbonate-Vitamin D 600-400 MG-UNIT tablet Take by mouth.    [provider]  clobetasol (TEMOVATE) 0.05 % external solution Apply 1 application topically 2 (two) times daily. Prn flares 07/30/19   Ralene Bathe, MD  clobetasol (TEMOVATE) 0.05 %  external solution Mix into CeraVe cream as directed. Apply to aa's rash QD-BID PRN flares. 01/29/20   Ralene Bathe, MD  cyanocobalamin 1000 MCG tablet Take by mouth.    [provider]  docusate sodium (COLACE) 100 MG capsule Take 1 tablet once or twice daily as needed for constipation 12/22/15   Hinda Kehr, MD  estradiol (ESTRACE) 0.5 MG tablet TAKE 1 TABLET EVERY DAY (NEED MD APPOINTMENT) 11/18/19   [provider]  fluticasone (FLONASE) 50 MCG/ACT nasal spray Place 1 spray into both nostrils 2 (two) times daily. 04/07/20   Cuthriell, Charline Bills, PA-C  HYDROcodone-acetaminophen (NORCO/VICODIN) 5-325 MG tablet Take 1-2 tablets by mouth every 4 (four) hours as needed for moderate pain. 12/22/15   Hinda Kehr, MD  HYDROcodone-acetaminophen (NORCO/VICODIN) 5-325 MG tablet Take 1 tablet by mouth every 4 (four) hours as needed for moderate pain. 05/15/20   Cuthriell, Charline Bills, PA-C  lisinopril-hydrochlorothiazide (ZESTORETIC) 10-12.5 MG tablet Take 1 tablet by mouth daily. 11/18/19   [provider]  loratadine-pseudoephedrine (CLARITIN-D 24-HOUR) 10-240 MG 24 hr tablet Take 1 tablet by mouth daily.    [provider]  methocarbamol (ROBAXIN) 500 MG tablet Take 1 tablet (500 mg total) by mouth 4 (four) times daily. 05/15/20   Cuthriell, Charline Bills, PA-C  predniSONE (DELTASONE) 10 MG tablet Take 1 tablet (10 mg total) by mouth daily. 05/15/20   Cuthriell, Charline Bills, PA-C  predniSONE (DELTASONE) 50 MG tablet Take 1 tablet (50 mg total) by mouth daily with breakfast. 04/07/20   Cuthriell, Charline Bills, PA-C  Allergies Cefdinir  Family History  Problem Relation Age of Onset  . Breast cancer Neg Hx     Social History Social History   Tobacco Use  . Smoking status: Never Smoker  . Smokeless tobacco: Never Used  Substance Use Topics  . Alcohol use: Yes    Review of Systems  Constitutional: No fever/chills Eyes: No visual changes. ENT: No sore  throat. Cardiovascular: Denies chest pain. Respiratory: Denies shortness of breath. Gastrointestinal: Positive for RLQ pain .  No nausea, no vomiting.  No diarrhea.  No constipation. Genitourinary: Negative for dysuria. Musculoskeletal: Negative for back pain. Skin: Negative for rash. Neurological: Negative for headaches, focal weakness or numbness.  ____________________________________________   PHYSICAL EXAM:  VITAL SIGNS: Vitals:   06/12/20 1257 06/12/20 1400  BP: 139/89 139/79  Pulse: 87 71  Resp: 16   Temp: 97.8 F (36.6 C)   SpO2: 98% 96%     Constitutional: Alert and oriented. Well appearing and in no acute distress. Eyes: Conjunctivae are normal. PERRL. EOMI. Head: Atraumatic. Nose: No congestion/rhinnorhea. Mouth/Throat: Mucous membranes are moist.  Oropharynx non-erythematous. Neck: No stridor. No cervical spine tenderness to palpation. Cardiovascular: Normal rate, regular rhythm. Grossly normal heart sounds.  Good peripheral circulation. Respiratory: Normal respiratory effort.  No retractions. Lungs CTAB. Gastrointestinal: Soft , nondistended,. No CVA tenderness. RLQ tenderness at McBurney's point without guarding.  Otherwise benign abdomen. Musculoskeletal: No lower extremity tenderness nor edema.  No joint effusions. No signs of acute trauma. Neurologic:  Normal speech and language. No gross focal neurologic deficits are appreciated. No gait instability noted. Skin:  Skin is warm, dry and intact. No rash noted. Psychiatric: Mood and affect are normal. Speech and behavior are normal.  ____________________________________________   LABS (all labs ordered are listed, but only abnormal results are displayed)  Labs Reviewed  COMPREHENSIVE METABOLIC PANEL - Abnormal; Notable for the following components:      Result Value   BUN 29 (*)    All other components within normal limits  CBC - Abnormal; Notable for the following components:   RBC 3.81 (*)     Platelets 406 (*)    All other components within normal limits  URINALYSIS, COMPLETE (UACMP) WITH MICROSCOPIC - Abnormal; Notable for the following components:   Color, Urine YELLOW (*)    APPearance CLEAR (*)    Bacteria, UA RARE (*)    All other components within normal limits  LIPASE, BLOOD   _______________________________________  RADIOLOGY  ED MD interpretation: CT abdomen/pelvis reviewed by me without evidence of acute intra-abdominal pathology.  Official radiology report(s): CT ABDOMEN PELVIS W CONTRAST  Result Date: 06/12/2020 CLINICAL DATA:  75 year old female with acute abdominal and pelvic pain. EXAM: CT ABDOMEN AND PELVIS WITH CONTRAST TECHNIQUE: Multidetector CT imaging of the abdomen and pelvis was performed using the standard protocol following bolus administration of intravenous contrast. CONTRAST:  111mL OMNIPAQUE IOHEXOL 300 MG/ML  SOLN COMPARISON:  12/22/2015 CT FINDINGS: Lower chest: No acute abnormality. Hepatobiliary: The liver and gallbladder are unremarkable except for a hepatic cyst. No biliary dilatation. Pancreas: Unremarkable Spleen: Unremarkable Adrenals/Urinary Tract: Kidneys, adrenal glands and bladder are unremarkable except for a stable 1.5 cm LEFT adrenal nodule/adenoma and a LEFT renal cyst. Stomach/Bowel: Stomach is within normal limits. Appendix appears normal. No evidence of bowel wall thickening, distention, or inflammatory changes. Vascular/Lymphatic: Aortic atherosclerosis. No enlarged abdominal or pelvic lymph nodes. Reproductive: Status post hysterectomy. No adnexal masses. Other: No ascites, pneumoperitoneum or focal collection. Musculoskeletal: No acute or suspicious bony  abnormalities. Degenerative changes in the lumbar spine again noted. IMPRESSION: 1. No evidence of acute abnormality. 2. Aortic Atherosclerosis (ICD10-I70.0). Electronically Signed   By: Margarette Canada M.D.   On: 06/12/2020 15:16     ____________________________________________   PROCEDURES and INTERVENTIONS  Procedure(s) performed (including Critical Care):  .1-3 Lead EKG Interpretation Performed by: Vladimir Crofts, MD Authorized by: Vladimir Crofts, MD     Interpretation: normal     ECG rate:  70   ECG rate assessment: normal     Rhythm: sinus rhythm     Ectopy: none     Conduction: normal      Medications  acetaminophen (TYLENOL) tablet 1,000 mg (has no administration in time range)  lactated ringers bolus 1,000 mL (1,000 mLs Intravenous New Bag/Given 06/12/20 1414)  morphine 4 MG/ML injection 4 mg (4 mg Intravenous Given 06/12/20 1414)  iohexol (OMNIPAQUE) 300 MG/ML solution 100 mL (100 mLs Intravenous Contrast Given 06/12/20 1455)    ____________________________________________   MDM / ED COURSE   75 year old woman presents to the ED with acute RLQ pain of uncertain etiology.  Normal vitals on room air.  Exam with isolated tenderness to the RLQ, otherwise benign abdomen.  She looks well overall without signs of distress, dehydration, trauma or any neurovascular deficits.  Blood work is reassuring without evidence of acute derangements.  No leukocytosis.  Intact renal function.  Urinalysis without infectious features.  CT imaging obtained to evaluate for acute appendicitis versus diverticular disease versus other, and demonstrates no evidence of acute intra-abdominal pathology.  I reexamined her abdomen and she continues to have some mild tenderness to the RLQ, but continues to look well overall clinically.  We discussed oral analgesics such as Tylenol, p.o. challenge and possible outpatient management.  Patient signed out to oncoming provider to follow-up on this p.o. challenge prior to expected discharge.  We discussed following up with her PCP and we discussed return precautions for the ED prior to signout.   Clinical Course as of 06/12/20 1527  Sat Jun 12, 2020  1526 Reassessed and educated patient of  reassuring CT results.  We discussed overall work-up being quite reassuring with her blood work is unremarkable as well.  We discussed Tylenol, p.o. challenge and possible outpatient management.  We discussed possible etiologies of her pain.   [DS]    Clinical Course User Index [DS] Vladimir Crofts, MD    ____________________________________________   FINAL CLINICAL IMPRESSION(S) / ED DIAGNOSES  Final diagnoses:  RLQ abdominal pain     ED Discharge Orders    None       Fady Stamps Tamala Julian   Note:  This document was prepared using Dragon voice recognition software and may include unintentional dictation errors.   Vladimir Crofts, MD 06/12/20 [phone]

## 2020-06-12 NOTE — ED Triage Notes (Signed)
Arrived POV. C/O lower right abdominal pain sent by Sharon Watson for possible appendicitis.   Pt stated pain began 4 days ago and felt like pulled muscle then progressed yesterday and radiates to back..Pt denies N/V.

## 2020-06-12 NOTE — ED Provider Notes (Signed)
3:57 PM Assumed care for off going team.   Blood pressure 139/79, pulse 71, temperature 97.8 F (36.6 C), temperature source Oral, resp. rate 16, height 5\' 5"  (1.651 m), weight 70.3 kg, SpO2 96 %.  See their HPI for full report but in brief RLQ pain 3-4 day, ct negative, plan for PO challenge and dc home.   4:52 PM patient is tolerating fluids and applesauce.  Patient is still questioning how the CT was negative.  I went over her CT results with her and the rest of her labs.  She is afebrile with normal vital signs and normal white count.  I discussed with patient that of course things can be missed and that if her symptoms are worsening or she develops any new symptoms she can return to the ER but at this time I have very low suspicion for appendicitis and her pain is all right lower quadrant but her vitals/labs are re-assuring.  We discussed coming back up with fevers, vomiting, worsening pain or any other concerns.  I discussed the provisional nature of ED diagnosis, the treatment so far, the ongoing plan of care, follow up appointments and return precautions with the patient and any family or support people present. They expressed understanding and agreed with the plan, discharged home.               Vanessa [place], MD 06/12/20 [phone]

## 2020-06-12 NOTE — Discharge Instructions (Addendum)
Your work-up was reassuring.  Take Tylenol 1 g every 8 hours and ibuprofen 600 every 6 hours with food as long as you are not on any blood thinners.  Return the ER if develop fevers, worsening pain, unable to eat or any other concerns

## 2020-06-12 NOTE — ED Notes (Signed)
Last food intake  9:30am  Bread with peanut butter / coffee

## 2020-06-12 NOTE — ED Notes (Signed)
ED Provider at bedside. 

## 2020-06-12 NOTE — ED Notes (Signed)
Discharge intructions reviewed with pt. Pt calm , collective denied pain or sob .Marland Kitchen

## 2020-06-30 DIAGNOSIS — B373 Candidiasis of vulva and vagina: Secondary | ICD-10-CM | POA: Diagnosis not present

## 2020-06-30 DIAGNOSIS — J302 Other seasonal allergic rhinitis: Secondary | ICD-10-CM | POA: Diagnosis not present

## 2020-06-30 DIAGNOSIS — J019 Acute sinusitis, unspecified: Secondary | ICD-10-CM | POA: Diagnosis not present

## 2020-06-30 DIAGNOSIS — B9689 Other specified bacterial agents as the cause of diseases classified elsewhere: Secondary | ICD-10-CM | POA: Diagnosis not present

## 2020-07-01 DIAGNOSIS — M5441 Lumbago with sciatica, right side: Secondary | ICD-10-CM | POA: Diagnosis not present

## 2020-07-01 DIAGNOSIS — M4310 Spondylolisthesis, site unspecified: Secondary | ICD-10-CM | POA: Diagnosis not present

## 2020-07-01 DIAGNOSIS — G8929 Other chronic pain: Secondary | ICD-10-CM | POA: Diagnosis not present

## 2020-07-06 ENCOUNTER — Other Ambulatory Visit: Payer: Self-pay | Admitting: Neurosurgery

## 2020-07-06 DIAGNOSIS — M5441 Lumbago with sciatica, right side: Secondary | ICD-10-CM

## 2020-07-06 DIAGNOSIS — G8929 Other chronic pain: Secondary | ICD-10-CM

## 2020-07-08 ENCOUNTER — Ambulatory Visit
Admission: RE | Admit: 2020-07-08 | Discharge: 2020-07-08 | Disposition: A | Discharge status: Home / Self Care | Payer: Medicare HMO | Source: Ambulatory Visit | Attending: Neurosurgery | Admitting: Neurosurgery

## 2020-07-08 ENCOUNTER — Other Ambulatory Visit: Payer: Self-pay

## 2020-07-08 DIAGNOSIS — M5441 Lumbago with sciatica, right side: Secondary | ICD-10-CM | POA: Diagnosis not present

## 2020-07-08 DIAGNOSIS — M545 Low back pain, unspecified: Secondary | ICD-10-CM | POA: Diagnosis not present

## 2020-07-08 DIAGNOSIS — G8929 Other chronic pain: Secondary | ICD-10-CM | POA: Insufficient documentation

## 2020-07-19 DIAGNOSIS — G8929 Other chronic pain: Secondary | ICD-10-CM | POA: Diagnosis not present

## 2020-07-19 DIAGNOSIS — M5441 Lumbago with sciatica, right side: Secondary | ICD-10-CM | POA: Diagnosis not present

## 2020-07-30 DIAGNOSIS — G8929 Other chronic pain: Secondary | ICD-10-CM | POA: Diagnosis not present

## 2020-07-30 DIAGNOSIS — M5441 Lumbago with sciatica, right side: Secondary | ICD-10-CM | POA: Diagnosis not present

## 2020-08-03 DIAGNOSIS — G8929 Other chronic pain: Secondary | ICD-10-CM | POA: Diagnosis not present

## 2020-08-03 DIAGNOSIS — M5441 Lumbago with sciatica, right side: Secondary | ICD-10-CM | POA: Diagnosis not present

## 2020-08-05 DIAGNOSIS — M5441 Lumbago with sciatica, right side: Secondary | ICD-10-CM | POA: Diagnosis not present

## 2020-08-05 DIAGNOSIS — G8929 Other chronic pain: Secondary | ICD-10-CM | POA: Diagnosis not present

## 2020-08-11 DIAGNOSIS — M5441 Lumbago with sciatica, right side: Secondary | ICD-10-CM | POA: Diagnosis not present

## 2020-08-11 DIAGNOSIS — G8929 Other chronic pain: Secondary | ICD-10-CM | POA: Diagnosis not present

## 2020-08-18 DIAGNOSIS — G8929 Other chronic pain: Secondary | ICD-10-CM | POA: Diagnosis not present

## 2020-08-18 DIAGNOSIS — I10 Essential (primary) hypertension: Secondary | ICD-10-CM | POA: Diagnosis not present

## 2020-08-18 DIAGNOSIS — M5441 Lumbago with sciatica, right side: Secondary | ICD-10-CM | POA: Diagnosis not present

## 2020-08-25 DIAGNOSIS — Z0000 Encounter for general adult medical examination without abnormal findings: Secondary | ICD-10-CM | POA: Diagnosis not present

## 2020-08-25 DIAGNOSIS — N1831 Chronic kidney disease, stage 3a: Secondary | ICD-10-CM | POA: Diagnosis not present

## 2020-08-25 DIAGNOSIS — Z8739 Personal history of other diseases of the musculoskeletal system and connective tissue: Secondary | ICD-10-CM | POA: Diagnosis not present

## 2020-08-25 DIAGNOSIS — I129 Hypertensive chronic kidney disease with stage 1 through stage 4 chronic kidney disease, or unspecified chronic kidney disease: Secondary | ICD-10-CM | POA: Diagnosis not present

## 2020-08-26 DIAGNOSIS — G8929 Other chronic pain: Secondary | ICD-10-CM | POA: Diagnosis not present

## 2020-08-26 DIAGNOSIS — M5441 Lumbago with sciatica, right side: Secondary | ICD-10-CM | POA: Diagnosis not present

## 2020-10-02 DIAGNOSIS — Z03818 Encounter for observation for suspected exposure to other biological agents ruled out: Secondary | ICD-10-CM | POA: Diagnosis not present

## 2020-10-02 DIAGNOSIS — R059 Cough, unspecified: Secondary | ICD-10-CM | POA: Diagnosis not present

## 2020-10-02 DIAGNOSIS — U071 COVID-19: Secondary | ICD-10-CM | POA: Diagnosis not present

## 2020-10-10 DIAGNOSIS — J0100 Acute maxillary sinusitis, unspecified: Secondary | ICD-10-CM | POA: Diagnosis not present

## 2020-10-10 DIAGNOSIS — J40 Bronchitis, not specified as acute or chronic: Secondary | ICD-10-CM | POA: Diagnosis not present

## 2020-10-10 DIAGNOSIS — R059 Cough, unspecified: Secondary | ICD-10-CM | POA: Diagnosis not present

## 2020-10-13 ENCOUNTER — Emergency Department: Payer: Medicare HMO

## 2020-10-13 ENCOUNTER — Other Ambulatory Visit: Payer: Self-pay

## 2020-10-13 ENCOUNTER — Emergency Department
Admission: EM | Admit: 2020-10-13 | Discharge: 2020-10-13 | Disposition: A | Discharge status: Home / Self Care | Payer: Medicare HMO | Attending: Emergency Medicine | Admitting: Emergency Medicine

## 2020-10-13 DIAGNOSIS — Z79899 Other long term (current) drug therapy: Secondary | ICD-10-CM | POA: Insufficient documentation

## 2020-10-13 DIAGNOSIS — J1282 Pneumonia due to coronavirus disease 2019: Secondary | ICD-10-CM | POA: Diagnosis not present

## 2020-10-13 DIAGNOSIS — U071 COVID-19: Secondary | ICD-10-CM | POA: Insufficient documentation

## 2020-10-13 DIAGNOSIS — R0602 Shortness of breath: Secondary | ICD-10-CM | POA: Diagnosis not present

## 2020-10-13 DIAGNOSIS — R079 Chest pain, unspecified: Secondary | ICD-10-CM | POA: Diagnosis not present

## 2020-10-13 DIAGNOSIS — R059 Cough, unspecified: Secondary | ICD-10-CM | POA: Diagnosis not present

## 2020-10-13 DIAGNOSIS — J189 Pneumonia, unspecified organism: Secondary | ICD-10-CM | POA: Diagnosis not present

## 2020-10-13 DIAGNOSIS — I10 Essential (primary) hypertension: Secondary | ICD-10-CM | POA: Diagnosis not present

## 2020-10-13 LAB — BASIC METABOLIC PANEL
Anion gap: 8 (ref 5–15)
BUN: 24 mg/dL — ABNORMAL HIGH (ref 8–23)
CO2: 23 mmol/L (ref 22–32)
Calcium: 9.7 mg/dL (ref 8.9–10.3)
Chloride: 104 mmol/L (ref 98–111)
Creatinine, Ser: 1.06 mg/dL — ABNORMAL HIGH (ref 0.44–1.00)
GFR, Estimated: 55 mL/min — ABNORMAL LOW (ref >60)
Glucose, Bld: 88 mg/dL (ref 70–99)
Potassium: 3.9 mmol/L (ref 3.5–5.1)
Sodium: 135 mmol/L (ref 135–145)

## 2020-10-13 LAB — CBC
HCT: 35.5 % — ABNORMAL LOW (ref 36.0–46.0)
Hemoglobin: 12.2 g/dL (ref 12.0–15.0)
MCH: 32.7 pg (ref 26.0–34.0)
MCHC: 34.4 g/dL (ref 30.0–36.0)
MCV: 95.2 fL (ref 80.0–100.0)
Platelets: 403 10*3/uL — ABNORMAL HIGH (ref 150–400)
RBC: 3.73 MIL/uL — ABNORMAL LOW (ref 3.87–5.11)
RDW: 12.2 % (ref 11.5–15.5)
WBC: 11.6 10*3/uL — ABNORMAL HIGH (ref 4.0–10.5)
nRBC: 0 % (ref 0.0–0.2)

## 2020-10-13 LAB — TROPONIN I (HIGH SENSITIVITY)
Troponin I (High Sensitivity): 5 ng/L (ref <18)
Troponin I (High Sensitivity): 5 ng/L (ref <18)

## 2020-10-13 MED ORDER — BENZONATATE 100 MG PO CAPS: 100.0000 mg | ORAL_CAPSULE | Freq: Three times a day (TID) | ORAL | 0 refills | Status: DC | PRN

## 2020-10-13 MED ORDER — DEXAMETHASONE SODIUM PHOSPHATE 10 MG/ML IJ SOLN
10.0000 mg | Freq: Once | INTRAMUSCULAR | Status: AC
  Administered 2020-10-13: 10 mg via INTRAVENOUS
  Filled 2020-10-13: qty 1

## 2020-10-13 MED ORDER — HYDROCODONE-ACETAMINOPHEN 5-325 MG PO TABS
1.0000 | ORAL_TABLET | Freq: Once | ORAL | Status: AC
  Administered 2020-10-13: 1 via ORAL
  Filled 2020-10-13 (×2): qty 1

## 2020-10-13 MED ORDER — IOHEXOL 350 MG/ML SOLN
75.0000 mL | Freq: Once | INTRAVENOUS | Status: AC | PRN
  Administered 2020-10-13: 75 mL via INTRAVENOUS
  Filled 2020-10-13: qty 75

## 2020-10-13 MED ORDER — PREDNISONE 10 MG PO TABS: 10.0000 mg | ORAL_TABLET | ORAL | 0 refills | Status: DC

## 2020-10-13 MED ORDER — PSEUDOEPH-BROMPHEN-DM 30-2-10 MG/5ML PO SYRP: 10.0000 mL | ORAL_SOLUTION | Freq: Four times a day (QID) | ORAL | 0 refills | Status: DC | PRN

## 2020-10-13 NOTE — ED Provider Notes (Signed)
[facility] Emergency Department Provider Note  ____________________________________________  Time seen: Approximately 5:16 PM  I have reviewed the triage vital signs and the nursing notes.   HISTORY  Chief Complaint Shortness of Breath    HPI Sharon Watson is a 75 y.o. female who presents the emergency department complaining of ongoing cough, shortness of breath and left-sided chest tightness.  Patient states that she was diagnosed with COVID 10 days ago.  She had not been improving symptomatically and followed up with urgent care a week later.  She states that she had a chest x-ray performed but was unable to see the results.  She is complaining of ongoing cough, shortness of breath and has chest tightness and a pleuritic chest pain like symptoms to the left side.  Patient denies any cough or hemoptysis.  She does have a history of a PE that was provoked in the past.  Patient is not on any anticoagulation at this time.  Patient is concerned primarily for the ongoing shortness of breath as it has been 10 days without improvement.       Past Medical History:  Diagnosis Date   Hypertension     There are no problems to display for this patient.   Past Surgical History:  Procedure Laterality Date   ABDOMINAL HYSTERECTOMY     FOOT SURGERY      Prior to Admission medications   Medication Sig Start Date End Date Taking? Authorizing Provider  benzonatate (TESSALON PERLES) 100 MG capsule Take 1 capsule (100 mg total) by mouth 3 (three) times daily as needed for cough. 10/13/20 10/13/21 Yes Shantoya Geurts, Charline Bills, PA-C  brompheniramine-pseudoephedrine-DM 30-2-10 MG/5ML syrup Take 10 mLs by mouth 4 (four) times daily as needed. 10/13/20  Yes Malin Sambrano, Charline Bills, PA-C  predniSONE (DELTASONE) 10 MG tablet Take 1 tablet (10 mg total) by mouth as directed. 10/13/20  Yes Demarrio Menges, Charline Bills, PA-C  Calcium Carbonate-Vitamin D 600-400 MG-UNIT tablet Take by mouth.     [provider]  clobetasol (TEMOVATE) 0.05 % external solution Apply 1 application topically 2 (two) times daily. Prn flares 07/30/19   Ralene Bathe, MD  clobetasol (TEMOVATE) 0.05 % external solution Mix into CeraVe cream as directed. Apply to aa's rash QD-BID PRN flares. 01/29/20   Ralene Bathe, MD  cyanocobalamin 1000 MCG tablet Take by mouth.    [provider]  docusate sodium (COLACE) 100 MG capsule Take 1 tablet once or twice daily as needed for constipation 12/22/15   Hinda Kehr, MD  estradiol (ESTRACE) 0.5 MG tablet TAKE 1 TABLET EVERY DAY (NEED MD APPOINTMENT) 11/18/19   [provider]  fluticasone (FLONASE) 50 MCG/ACT nasal spray Place 1 spray into both nostrils 2 (two) times daily. 04/07/20   Jannice Beitzel, Charline Bills, PA-C  HYDROcodone-acetaminophen (NORCO/VICODIN) 5-325 MG tablet Take 1-2 tablets by mouth every 4 (four) hours as needed for moderate pain. 12/22/15   Hinda Kehr, MD  HYDROcodone-acetaminophen (NORCO/VICODIN) 5-325 MG tablet Take 1 tablet by mouth every 4 (four) hours as needed for moderate pain. 05/15/20   Ehtan Delfavero, Charline Bills, PA-C  lisinopril-hydrochlorothiazide (ZESTORETIC) 10-12.5 MG tablet Take 1 tablet by mouth daily. 11/18/19   [provider]  loratadine-pseudoephedrine (CLARITIN-D 24-HOUR) 10-240 MG 24 hr tablet Take 1 tablet by mouth daily.    [provider]  methocarbamol (ROBAXIN) 500 MG tablet Take 1 tablet (500 mg total) by mouth 4 (four) times daily. 05/15/20   Elza Varricchio, Charline Bills, PA-C    Allergies Cefdinir  Family History  Problem Relation Age of Onset   Breast cancer Neg Hx     Social History Social History   Tobacco Use   Smoking status: Never   Smokeless tobacco: Never  Substance Use Topics   Alcohol use: Yes     Review of Systems  Constitutional: No fever/chills Eyes: No visual changes. No discharge ENT: No upper respiratory complaints. Cardiovascular: no chest  pain. Respiratory: no cough.  Ongoing SOB. Gastrointestinal: No abdominal pain.  No nausea, no vomiting.  No diarrhea.  No constipation. Genitourinary: Negative for dysuria. No hematuria Musculoskeletal: Negative for musculoskeletal pain. Skin: Negative for rash, abrasions, lacerations, ecchymosis. Neurological: Negative for headaches, focal weakness or numbness.  10 System ROS otherwise negative.  ____________________________________________   PHYSICAL EXAM:  VITAL SIGNS: ED Triage Vitals  Enc Vitals Group     BP 10/13/20 1416 (!) 105/91     Pulse Rate 10/13/20 1416 89     Resp 10/13/20 1416 (!) 22     Temp 10/13/20 1416 97.8 F (36.6 C)     Temp Source 10/13/20 1416 Oral     SpO2 10/13/20 1419 100 %     Weight 10/13/20 1417 155 lb (70.3 kg)     Height 10/13/20 1417 '5\' 6"'$  (1.676 m)     Head Circumference --      Peak Flow --      Pain Score 10/13/20 1417 0     Pain Loc --      Pain Edu? --      Excl. in [place]? --      Constitutional: Alert and oriented. Well appearing and in no acute distress. Eyes: Conjunctivae are normal. PERRL. EOMI. Head: Atraumatic. ENT:      Ears:       Nose: No congestion/rhinnorhea.      Mouth/Throat: Mucous membranes are moist.  Neck: No stridor.   Hematological/Lymphatic/Immunilogical: No cervical lymphadenopathy Cardiovascular: Normal rate, regular rhythm. Normal S1 and S2.  Good peripheral circulation. Respiratory: Normal respiratory effort without tachypnea or retractions. Lungs CTAB specifically no wheezing, rales or rhonchi. Good air entry to the bases with no decreased or absent breath sounds. Musculoskeletal: Full range of motion to all extremities. No gross deformities appreciated. Neurologic:  Normal speech and language. No gross focal neurologic deficits are appreciated.  Skin:  Skin is warm, dry and intact. No rash noted. Psychiatric: Mood and affect are normal. Speech and behavior are normal. Patient exhibits appropriate insight  and judgement.   ____________________________________________   LABS (all labs ordered are listed, but only abnormal results are displayed)  Labs Reviewed  BASIC METABOLIC PANEL - Abnormal; Notable for the following components:      Result Value   BUN 24 (*)    Creatinine, Ser 1.06 (*)    GFR, Estimated 55 (*)    All other components within normal limits  CBC - Abnormal; Notable for the following components:   WBC 11.6 (*)    RBC 3.73 (*)    HCT 35.5 (*)    Platelets 403 (*)    All other components within normal limits  TROPONIN I (HIGH SENSITIVITY)  TROPONIN I (HIGH SENSITIVITY)   ____________________________________________  EKG   ____________________________________________  RADIOLOGY I personally viewed and evaluated these images as part of my medical decision making, as well as reviewing the written report by the radiologist.  ED Provider Interpretation: No evidence of PE.  Ongoing findings consistent with COVID-pneumonia.  DG Chest 2 View  Result Date: 10/13/2020 CLINICAL  DATA:  Chest pain EXAM: CHEST - 2 VIEW COMPARISON:  04/07/2020 FINDINGS: Heart and mediastinal contours are within normal limits. No focal opacities or effusions. No acute bony abnormality. Degenerative changes in the thoracic spine. IMPRESSION: No active cardiopulmonary disease. Electronically Signed   By: Rolm Baptise M.D.   On: 10/13/2020 15:13   CT Angio Chest PE W and/or Wo Contrast  Result Date: 10/13/2020 CLINICAL DATA:  Bronchitis. Shortness of breath and cough. Recent COVID diagnosis. EXAM: CT ANGIOGRAPHY CHEST WITH CONTRAST TECHNIQUE: Multidetector CT imaging of the chest was performed using the standard protocol during bolus administration of intravenous contrast. Multiplanar CT image reconstructions and MIPs were obtained to evaluate the vascular anatomy. CONTRAST:  66m OMNIPAQUE IOHEXOL 350 MG/ML SOLN COMPARISON:  Chest radiograph 10/13/2020 and CT a chest 04/07/2020 FINDINGS: Despite  efforts by the technologist and patient, motion artifact is present on today's exam and could not be eliminated. This reduces exam sensitivity and specificity. Cardiovascular: Accounting for the motion artifact, no definite filling defect is identified in the pulmonary arterial tree to indicate pulmonary embolus. Atherosclerotic calcification of the aortic arch and branch vessels noted. Mediastinum/Nodes: Unremarkable Lungs/Pleura: Mild scattered patchy ground-glass densities in the lungs favoring COVID pneumonia. Upper Abdomen: 2.1 by 1.8 cm stable fluid density lesion posteriorly in the right hepatic lobe on image 77 series 4 favoring cyst. Musculoskeletal: Thoracic spondylosis. Review of the MIP images confirms the above findings. IMPRESSION: 1. Scattered mild ground-glass densities in the lungs favoring COVID pneumonia. 2. No filling defect is identified in the pulmonary arterial tree to suggest pulmonary embolus. 3.  Aortic Atherosclerosis (ICD10-I70.0). Electronically Signed   By: WVan ClinesM.D.   On: 10/13/2020 18:59    ____________________________________________    PROCEDURES  Procedure(s) performed:    Procedures    Medications  HYDROcodone-acetaminophen (NORCO/VICODIN) 5-325 MG per tablet 1 tablet (1 tablet Oral Given 10/13/20 1802)  iohexol (OMNIPAQUE) 350 MG/ML injection 75 mL (75 mLs Intravenous Contrast Given 10/13/20 1818)  dexamethasone (DECADRON) injection 10 mg (10 mg Intravenous Given 10/13/20 2125)     ____________________________________________   INITIAL IMPRESSION / ASSESSMENT AND PLAN / ED COURSE  Pertinent labs & imaging results that were available during my care of the patient were reviewed by me and considered in my medical decision making (see chart for details).  Review of the [place] CSRS was performed in accordance of the [place] to dispensing any controlled drugs.           Patient's diagnosis is consistent with COVID-19.  Patient presents  emergency department ongoing cough and shortness of breath with a diagnosis of COVID 10 days ago.  Patient felt like she should be clear symptoms at this point but continues to experience symptoms.  Overall physical exam was reassuring.  Given the patient's somewhat pleuritic pain, ongoing shortness of breath and cough in the setting of COVID infection with history of PE, patient had angio chest was revealed no evidence of PE.  Findings consistent with COVID-pneumonia.  I do not suspect a bacterial component at this time.  Patient will be placed on steroids, continue albuterol at home.  She will have additional symptom control medications.  Return precautions discussed with the patient.  No indication for further work-up at this time..  Patient is given ED precautions to return to the ED for any worsening or new symptoms.     ____________________________________________  FINAL CLINICAL IMPRESSION(S) / ED DIAGNOSES  Final diagnoses:  COVID-19      NEW MEDICATIONS  STARTED DURING THIS VISIT:  ED Discharge Orders          Ordered    predniSONE (DELTASONE) 10 MG tablet  As directed       Note to Pharmacy: Take on a pattern of 6, 6, 5, 5, 4, 4, 3, 3, 2, 2, 1, 1   10/13/20 2120    benzonatate (TESSALON PERLES) 100 MG capsule  3 times daily PRN        10/13/20 2120    brompheniramine-pseudoephedrine-DM 30-2-10 MG/5ML syrup  4 times daily PRN        10/13/20 2120                This chart was dictated using voice recognition software/Dragon. Despite best efforts to proofread, errors can occur which can change the meaning. Any change was purely unintentional.    Darletta Moll, PA-C 10/13/20 2224    Naaman Plummer, MD 10/14/20 [phone]

## 2020-10-13 NOTE — ED Triage Notes (Signed)
Pt comes pov after being dx with covid over a week ago and then bronchitis recently. PCP did a cxr for pneumonia rule out. Pt reports that her SOB and cough is not getting any better.

## 2020-10-21 DIAGNOSIS — R052 Subacute cough: Secondary | ICD-10-CM | POA: Diagnosis not present

## 2020-10-21 DIAGNOSIS — R5381 Other malaise: Secondary | ICD-10-CM | POA: Diagnosis not present

## 2020-10-21 DIAGNOSIS — Z8701 Personal history of pneumonia (recurrent): Secondary | ICD-10-CM | POA: Diagnosis not present

## 2020-10-21 DIAGNOSIS — R0602 Shortness of breath: Secondary | ICD-10-CM | POA: Diagnosis not present

## 2020-10-21 DIAGNOSIS — R5383 Other fatigue: Secondary | ICD-10-CM | POA: Diagnosis not present

## 2020-10-21 DIAGNOSIS — K219 Gastro-esophageal reflux disease without esophagitis: Secondary | ICD-10-CM | POA: Diagnosis not present

## 2020-11-09 DIAGNOSIS — Z8616 Personal history of COVID-19: Secondary | ICD-10-CM | POA: Diagnosis not present

## 2020-11-09 DIAGNOSIS — R051 Acute cough: Secondary | ICD-10-CM | POA: Diagnosis not present

## 2020-11-09 DIAGNOSIS — J019 Acute sinusitis, unspecified: Secondary | ICD-10-CM | POA: Diagnosis not present

## 2020-11-09 DIAGNOSIS — Z8701 Personal history of pneumonia (recurrent): Secondary | ICD-10-CM | POA: Diagnosis not present

## 2020-11-18 DIAGNOSIS — Z8701 Personal history of pneumonia (recurrent): Secondary | ICD-10-CM | POA: Diagnosis not present

## 2020-11-23 DIAGNOSIS — R300 Dysuria: Secondary | ICD-10-CM | POA: Diagnosis not present

## 2020-12-02 DIAGNOSIS — H40003 Preglaucoma, unspecified, bilateral: Secondary | ICD-10-CM | POA: Diagnosis not present

## 2020-12-07 DIAGNOSIS — D649 Anemia, unspecified: Secondary | ICD-10-CM | POA: Diagnosis not present

## 2021-01-13 ENCOUNTER — Other Ambulatory Visit: Payer: Self-pay | Admitting: Obstetrics and Gynecology

## 2021-01-13 DIAGNOSIS — Z1331 Encounter for screening for depression: Secondary | ICD-10-CM | POA: Diagnosis not present

## 2021-01-13 DIAGNOSIS — Z01419 Encounter for gynecological examination (general) (routine) without abnormal findings: Secondary | ICD-10-CM | POA: Diagnosis not present

## 2021-01-13 DIAGNOSIS — Z1231 Encounter for screening mammogram for malignant neoplasm of breast: Secondary | ICD-10-CM

## 2021-02-03 ENCOUNTER — Other Ambulatory Visit: Payer: Self-pay

## 2021-02-03 ENCOUNTER — Ambulatory Visit: Payer: Medicare HMO | Admitting: Dermatology

## 2021-02-03 DIAGNOSIS — L814 Other melanin hyperpigmentation: Secondary | ICD-10-CM

## 2021-02-03 DIAGNOSIS — D1801 Hemangioma of skin and subcutaneous tissue: Secondary | ICD-10-CM

## 2021-02-03 DIAGNOSIS — L821 Other seborrheic keratosis: Secondary | ICD-10-CM

## 2021-02-03 DIAGNOSIS — Z1283 Encounter for screening for malignant neoplasm of skin: Secondary | ICD-10-CM

## 2021-02-03 DIAGNOSIS — L309 Dermatitis, unspecified: Secondary | ICD-10-CM

## 2021-02-03 DIAGNOSIS — L209 Atopic dermatitis, unspecified: Secondary | ICD-10-CM | POA: Diagnosis not present

## 2021-02-03 DIAGNOSIS — L720 Epidermal cyst: Secondary | ICD-10-CM | POA: Diagnosis not present

## 2021-02-03 DIAGNOSIS — L578 Other skin changes due to chronic exposure to nonionizing radiation: Secondary | ICD-10-CM | POA: Diagnosis not present

## 2021-02-03 DIAGNOSIS — D229 Melanocytic nevi, unspecified: Secondary | ICD-10-CM | POA: Diagnosis not present

## 2021-02-03 MED ORDER — CLOBETASOL PROPIONATE 0.05 % EX SOLN: CUTANEOUS | 6 refills | Status: DC

## 2021-02-03 NOTE — Patient Instructions (Signed)
Melanoma ABCDEs ? ?Melanoma is the most dangerous type of skin cancer, and is the leading cause of death from skin disease.  You are more likely to develop melanoma if you: ?Have light-colored skin, light-colored eyes, or red or blond hair ?Spend a lot of time in the sun ?Tan regularly, either outdoors or in a tanning bed ?Have had blistering sunburns, especially during childhood ?Have a close family member who has had a melanoma ?Have atypical moles or large birthmarks ? ?Early detection of melanoma is key since treatment is typically straightforward and cure rates are extremely high if we catch it early.  ? ?The first sign of melanoma is often a change in a mole or a new dark spot.  The ABCDE system is a way of remembering the signs of melanoma. ? ?A for asymmetry:  The two halves do not match. ?B for border:  The edges of the growth are irregular. ?C for color:  A mixture of colors are present instead of an even brown color. ?D for diameter:  Melanomas are usually (but not always) greater than 6mm - the size of a pencil eraser. ?E for evolution:  The spot keeps changing in size, shape, and color. ? ?Please check your skin once per month between visits. You can use a small mirror in front and a large mirror behind you to keep an eye on the back side or your body.  ? ?If you see any new or changing lesions before your next follow-up, please call to schedule a visit. ? ?Please continue daily skin protection including broad spectrum sunscreen SPF 30+ to sun-exposed areas, reapplying every 2 hours as needed when you're outdoors.  ? ?Staying in the shade or wearing long sleeves, sun glasses (UVA+UVB protection) and wide brim hats (4-inch brim around the entire circumference of the hat) are also recommended for sun protection.   ? ? ?If You Need Anything After Your Visit ? ?If you have any questions or concerns for your doctor, please call our main line at [phone] and press option 4 to reach your doctor's medical  assistant. If no one answers, please leave a voicemail as directed and we will return your call as soon as possible. Messages left after 4 pm will be answered the following business day.  ? ?You may also send us a message via MyChart. We typically respond to MyChart messages within 1-2 business days. ? ?For prescription refills, please ask your pharmacy to contact our office. Our fax number is [phone]. ? ?If you have an urgent issue when the clinic is closed that cannot wait until the next business day, you can page your doctor at the number below.   ? ?Please note that while we do our best to be available for urgent issues outside of office hours, we are not available 24/7.  ? ?If you have an urgent issue and are unable to reach us, you may choose to seek medical care at your doctor's office, retail clinic, urgent care center, or emergency room. ? ?If you have a medical emergency, please immediately call 911 or go to the emergency department. ? ?Pager Numbers ? ?- Dr. Kowalski: [phone] ? ?- Dr. Moye: [phone] ? ?- Dr. Stewart: [phone] ? ?In the event of inclement weather, please call our main line at [phone] for an update on the status of any delays or closures. ? ?Dermatology Medication Tips: ?Please keep the boxes that topical medications come in in order to help keep track of the instructions   about where and how to use these. Pharmacies typically print the medication instructions only on the boxes and not directly on the medication tubes.  ? ?If your medication is too expensive, please contact our office at [phone] option 4 or send us a message through MyChart.  ? ?We are unable to tell what your co-pay for medications will be in advance as this is different depending on your insurance coverage. However, we may be able to find a substitute medication at lower cost or fill out paperwork to get insurance to cover a needed medication.  ? ?If a prior authorization is required to get your  medication covered by your insurance company, please allow us 1-2 business days to complete this process. ? ?Drug prices often vary depending on where the prescription is filled and some pharmacies may offer cheaper prices. ? ?The website www.goodrx.com contains coupons for medications through different pharmacies. The prices here do not account for what the cost may be with help from insurance (it may be cheaper with your insurance), but the website can give you the price if you did not use any insurance.  ?- You can print the associated coupon and take it with your prescription to the pharmacy.  ?- You may also stop by our office during regular business hours and pick up a GoodRx coupon card.  ?- If you need your prescription sent electronically to a different pharmacy, notify our office through [facility] MyChart or by phone at [phone] option 4. ? ? ? ? ?Si Usted Necesita Algo Despu?s de Su Visita ? ?Tambi?n puede enviarnos un mensaje a trav?s de MyChart. Por lo general respondemos a los mensajes de MyChart en el transcurso de 1 a 2 d?as h?biles. ? ?Para renovar recetas, por favor pida a su farmacia que se ponga en contacto con nuestra oficina. Nuestro n?mero de fax es el [phone]. ? ?Si tiene un asunto urgente cuando la cl?nica est? cerrada y que no puede esperar hasta el siguiente d?a h?bil, puede llamar/localizar a su doctor(a) al n?mero que aparece a continuaci?n.  ? ?Por favor, tenga en cuenta que aunque hacemos todo lo posible para estar disponibles para asuntos urgentes fuera del horario de oficina, no estamos disponibles las 24 horas del d?a, los 7 d?as de la semana.  ? ?Si tiene un problema urgente y no puede comunicarse con nosotros, puede optar por buscar atenci?n m?dica  en el consultorio de su doctor(a), en una cl?nica privada, en un centro de atenci?n urgente o en una sala de emergencias. ? ?Si tiene una emergencia m?dica, por favor llame inmediatamente al 911 o vaya a la sala de  emergencias. ? ?N?meros de b?per ? ?- Dr. Kowalski: [phone] ? ?- Dra. Moye: [phone] ? ?- Dra. Stewart: [phone] ? ?En caso de inclemencias del tiempo, por favor llame a nuestra l?nea principal al [phone] para una actualizaci?n sobre el estado de cualquier retraso o cierre. ? ?Consejos para la medicaci?n en dermatolog?a: ?Por favor, guarde las cajas en las que vienen los medicamentos de uso t?pico para ayudarle a seguir las instrucciones sobre d?nde y c?mo usarlos. Las farmacias generalmente imprimen las instrucciones del medicamento s?lo en las cajas y no directamente en los tubos del medicamento.  ? ?Si su medicamento es muy caro, por favor, p?ngase en contacto con nuestra oficina llamando al [phone] y presione la opci?n 4 o env?enos un mensaje a trav?s de MyChart.  ? ?No podemos decirle cu?l ser? su copago por los medicamentos por adelantado ya que   esto es diferente dependiendo de la cobertura de su seguro. Sin embargo, es posible que podamos encontrar un medicamento sustituto a menor costo o llenar un formulario para que el seguro cubra el medicamento que se considera necesario.  ? ?Si se requiere una autorizaci?n previa para que su compa??a de seguros cubra su medicamento, por favor perm?tanos de 1 a 2 d?as h?biles para completar este proceso. ? ?Los precios de los medicamentos var?an con frecuencia dependiendo del lugar de d?nde se surte la receta y alguna farmacias pueden ofrecer precios m?s baratos. ? ?El sitio web www.goodrx.com tiene cupones para medicamentos de diferentes farmacias. Los precios aqu? no tienen en cuenta lo que podr?a costar con la ayuda del seguro (puede ser m?s barato con su seguro), pero el sitio web puede darle el precio si no utiliz? ning?n seguro.  ?- Puede imprimir el cup?n correspondiente y llevarlo con su receta a la farmacia.  ?- Tambi?n puede pasar por nuestra oficina durante el horario de atenci?n regular y recoger una tarjeta de cupones de GoodRx.  ?- Si  necesita que su receta se env?e electr?nicamente a una farmacia diferente, informe a nuestra oficina a trav?s de MyChart de [place] o por tel?fono llamando al [phone] y presione la opci?n 4. ? ?

## 2021-02-03 NOTE — Progress Notes (Signed)
   Follow-Up Visit   Subjective  Sharon Watson is a 75 y.o. female who presents for the following: Annual Exam (Patient here today for 1 year tbse. She reports rash at right leg is still present. ). The patient presents for Total-Body Skin Exam (TBSE) for skin cancer screening and mole check.  The patient has spots, moles and lesions to be evaluated, some may be new or changing and the patient has concerns that these could be cancer.  The following portions of the chart were reviewed this encounter and updated as appropriate:  Tobacco  Allergies  Meds  Problems  Med Hx  Surg Hx  Fam Hx     Review of Systems: No other skin or systemic complaints except as noted in HPI or Assessment and Plan.  Objective  Well appearing patient in no apparent distress; mood and affect are within normal limits.  A full examination was performed including scalp, head, eyes, ears, nose, lips, neck, chest, axillae, abdomen, back, buttocks, bilateral upper extremities, bilateral lower extremities, hands, feet, fingers, toes, fingernails, and toenails. All findings within normal limits unless otherwise noted below. Milia at face Crusted pink patches and lower back and lower legs    Assessment & Plan  Eczema, Trunk, extremities Patient reports bothered only in fall  Improved with Clobetasol/CeraVe mix -but not to goal.  Flared recently. Continue Clobetasol/CeraVe mix QD-BID PRN. Continue CeraVe cream moisturizer daily.    Atopic dermatitis (eczema) is a chronic, relapsing, pruritic condition that can significantly affect quality of life. It is often associated with allergic rhinitis and/or asthma and can require treatment with topical medications, phototherapy, or in severe cases a biologic medication called Dupixent.  Related Medications clobetasol (TEMOVATE) 0.05 % external solution Mix into CeraVe cream as directed. Apply to aa's rash QD-BID PRN flares.  Skin cancer screening  Lentigines - Scattered  tan macules - Due to sun exposure - Benign-appearing, observe - Recommend daily broad spectrum sunscreen SPF 30+ to sun-exposed areas, reapply every 2 hours as needed. - Call for any changes  Seborrheic Keratoses - Stuck-on, waxy, tan-brown papules and/or plaques  - Benign-appearing - Discussed benign etiology and prognosis. - Observe - Call for any changes  Melanocytic Nevi - Tan-brown and/or pink-flesh-colored symmetric macules and papules - Benign appearing on exam today - Observation - Call clinic for new or changing moles - Recommend daily use of broad spectrum spf 30+ sunscreen to sun-exposed areas.   Hemangiomas - Red papules at right breast  - Discussed benign nature - Observe - Call for any changes  Actinic Damage - Chronic condition, secondary to cumulative UV/sun exposure - diffuse scaly erythematous macules with underlying dyspigmentation - Recommend daily broad spectrum sunscreen SPF 30+ to sun-exposed areas, reapply every 2 hours as needed.  - Staying in the shade or wearing long sleeves, sun glasses (UVA+UVB protection) and wide brim hats (4-inch brim around the entire circumference of the hat) are also recommended for sun protection.  - Call for new or changing lesions.  Milia of the face Benign-appearing.  Observation.  Call clinic for new or changing lesions.  Recommend daily use of broad spectrum spf 30+ sunscreen to sun-exposed areas.   Skin cancer screening performed today.  Return for 1 year tbse and eczema . IRuthell Rummage, CMA, am acting as scribe for Sarina Ser, MD. Documentation: I have reviewed the above documentation for accuracy and completeness, and I agree with the above.  Sarina Ser, MD

## 2021-02-18 ENCOUNTER — Encounter: Payer: Self-pay | Admitting: Dermatology

## 2021-02-22 ENCOUNTER — Other Ambulatory Visit: Payer: Self-pay

## 2021-02-22 ENCOUNTER — Ambulatory Visit
Admission: RE | Admit: 2021-02-22 | Discharge: 2021-02-22 | Disposition: A | Discharge status: Home / Self Care | Payer: Medicare HMO | Source: Ambulatory Visit | Attending: Obstetrics and Gynecology | Admitting: Obstetrics and Gynecology

## 2021-02-22 DIAGNOSIS — Z1231 Encounter for screening mammogram for malignant neoplasm of breast: Secondary | ICD-10-CM | POA: Diagnosis not present

## 2021-04-16 DIAGNOSIS — Z20822 Contact with and (suspected) exposure to covid-19: Secondary | ICD-10-CM | POA: Diagnosis not present

## 2021-04-16 DIAGNOSIS — R6889 Other general symptoms and signs: Secondary | ICD-10-CM | POA: Diagnosis not present

## 2021-04-16 DIAGNOSIS — J019 Acute sinusitis, unspecified: Secondary | ICD-10-CM | POA: Diagnosis not present

## 2021-05-05 DIAGNOSIS — B3731 Acute candidiasis of vulva and vagina: Secondary | ICD-10-CM | POA: Diagnosis not present

## 2021-05-05 DIAGNOSIS — B9689 Other specified bacterial agents as the cause of diseases classified elsewhere: Secondary | ICD-10-CM | POA: Diagnosis not present

## 2021-05-05 DIAGNOSIS — J329 Chronic sinusitis, unspecified: Secondary | ICD-10-CM | POA: Diagnosis not present

## 2021-06-02 DIAGNOSIS — H6983 Other specified disorders of Eustachian tube, bilateral: Secondary | ICD-10-CM | POA: Diagnosis not present

## 2021-06-02 DIAGNOSIS — J301 Allergic rhinitis due to pollen: Secondary | ICD-10-CM | POA: Diagnosis not present

## 2021-06-02 DIAGNOSIS — J3489 Other specified disorders of nose and nasal sinuses: Secondary | ICD-10-CM | POA: Diagnosis not present

## 2021-06-02 DIAGNOSIS — H40003 Preglaucoma, unspecified, bilateral: Secondary | ICD-10-CM | POA: Diagnosis not present

## 2021-07-14 DIAGNOSIS — J301 Allergic rhinitis due to pollen: Secondary | ICD-10-CM | POA: Diagnosis not present

## 2021-07-14 DIAGNOSIS — H90A21 Sensorineural hearing loss, unilateral, right ear, with restricted hearing on the contralateral side: Secondary | ICD-10-CM | POA: Diagnosis not present

## 2021-07-14 DIAGNOSIS — J329 Chronic sinusitis, unspecified: Secondary | ICD-10-CM | POA: Diagnosis not present

## 2021-08-13 ENCOUNTER — Emergency Department
Admission: EM | Admit: 2021-08-13 | Discharge: 2021-08-13 | Disposition: A | Discharge status: Home / Self Care | Payer: No Typology Code available for payment source | Attending: Emergency Medicine | Admitting: Emergency Medicine

## 2021-08-13 ENCOUNTER — Other Ambulatory Visit: Payer: Self-pay

## 2021-08-13 ENCOUNTER — Encounter: Payer: Self-pay | Admitting: Emergency Medicine

## 2021-08-13 DIAGNOSIS — N3091 Cystitis, unspecified with hematuria: Secondary | ICD-10-CM | POA: Diagnosis not present

## 2021-08-13 DIAGNOSIS — Z79899 Other long term (current) drug therapy: Secondary | ICD-10-CM | POA: Diagnosis not present

## 2021-08-13 DIAGNOSIS — R319 Hematuria, unspecified: Secondary | ICD-10-CM

## 2021-08-13 DIAGNOSIS — N3090 Cystitis, unspecified without hematuria: Secondary | ICD-10-CM

## 2021-08-13 DIAGNOSIS — I10 Essential (primary) hypertension: Secondary | ICD-10-CM | POA: Insufficient documentation

## 2021-08-13 DIAGNOSIS — R300 Dysuria: Secondary | ICD-10-CM | POA: Diagnosis not present

## 2021-08-13 DIAGNOSIS — N3001 Acute cystitis with hematuria: Secondary | ICD-10-CM | POA: Diagnosis not present

## 2021-08-13 LAB — URINALYSIS, ROUTINE W REFLEX MICROSCOPIC
Bacteria, UA: NONE SEEN
Bilirubin Urine: NEGATIVE
Glucose, UA: NEGATIVE mg/dL
Ketones, ur: NEGATIVE mg/dL
Nitrite: NEGATIVE
Protein, ur: 100 mg/dL — AB
RBC / HPF: >50 RBC/hpf — ABNORMAL HIGH (ref 0–5)
Specific Gravity, Urine: 1.019 (ref 1.005–1.030)
WBC, UA: >50 WBC/hpf — ABNORMAL HIGH (ref 0–5)
pH: 7 (ref 5.0–8.0)

## 2021-08-13 MED ORDER — ONDANSETRON 4 MG PO TBDP: 4.0000 mg | ORAL_TABLET | Freq: Three times a day (TID) | ORAL | 0 refills | Status: DC | PRN

## 2021-08-13 MED ORDER — PHENAZOPYRIDINE HCL 200 MG PO TABS: 200.0000 mg | ORAL_TABLET | Freq: Three times a day (TID) | ORAL | 0 refills | Status: DC | PRN

## 2021-08-13 MED ORDER — HYDROCODONE-ACETAMINOPHEN 5-325 MG PO TABS
1.0000 | ORAL_TABLET | Freq: Once | ORAL | Status: AC
  Administered 2021-08-13: 1 via ORAL
  Filled 2021-08-13: qty 1

## 2021-08-13 MED ORDER — HYDROCODONE-ACETAMINOPHEN 5-325 MG PO TABS: 1.0000 | ORAL_TABLET | Freq: Four times a day (QID) | ORAL | 0 refills | Status: DC | PRN

## 2021-08-13 MED ORDER — PHENAZOPYRIDINE HCL 200 MG PO TABS
200.0000 mg | ORAL_TABLET | Freq: Once | ORAL | Status: AC
  Administered 2021-08-13: 200 mg via ORAL
  Filled 2021-08-13: qty 1

## 2021-08-13 MED ORDER — NITROFURANTOIN MONOHYD MACRO 100 MG PO CAPS: 100.0000 mg | ORAL_CAPSULE | Freq: Two times a day (BID) | ORAL | 0 refills | Status: DC

## 2021-08-13 MED ORDER — NITROFURANTOIN MONOHYD MACRO 100 MG PO CAPS
100.0000 mg | ORAL_CAPSULE | Freq: Once | ORAL | Status: AC
  Administered 2021-08-13: 100 mg via ORAL
  Filled 2021-08-13: qty 1

## 2021-08-13 NOTE — Discharge Instructions (Addendum)
1.  Take antibiotic as prescribed (Macrobid '100mg'$  twice daily x7 days). 2.  You may take Ibuprofen as needed for pain; Norco as needed for more severe pain.  You may take Zofran as needed for nausea. 3.  Take Pyridium as needed for urinary discomfort. 4.  Drink plenty of fluids daily. 5.  Return to the ER for worsening symptoms, persistent vomiting, difficulty breathing or other concerns.

## 2021-08-13 NOTE — ED Triage Notes (Signed)
Pt presents to ER from home with urinary symptoms, reports hematuria and suprapubic pain. Pt  talks in complete sentences no respiratory distress noted

## 2021-08-13 NOTE — ED Provider Notes (Signed)
[facility] Provider Note    Event Date/Time   First MD Initiated Contact with Patient 08/13/21 0200     (approximate)   History   Hematuria   HPI  Sharon Watson is a 76 y.o. female who presents to the ED from home with a chief complaint of hematuria and suprapubic pain which began today.  Denies anticoagulant use.  Also reports dysuria and urinary frequency.  Denies fever, chills, chest pain, shortness of breath, flank pain, nausea, vomiting or dizziness.     Past Medical History   Past Medical History:  Diagnosis Date   Hypertension      Active Problem List  There are no problems to display for this patient.    Past Surgical History   Past Surgical History:  Procedure Laterality Date   ABDOMINAL HYSTERECTOMY     FOOT SURGERY       Home Medications   Prior to Admission medications   Medication Sig Start Date End Date Taking? Authorizing Provider  ondansetron (ZOFRAN-ODT) 4 MG disintegrating tablet Take 1 tablet (4 mg total) by mouth every 8 (eight) hours as needed for nausea or vomiting. 08/13/21  Yes Paulette Blanch, MD  phenazopyridine (PYRIDIUM) 200 MG tablet Take 1 tablet (200 mg total) by mouth 3 (three) times daily as needed for pain. 08/13/21  Yes Paulette Blanch, MD  acetaminophen (TYLENOL) 500 MG tablet Take 500 mg by mouth every 6 (six) hours as needed.    [provider]  Calcium Carbonate-Vitamin D 600-400 MG-UNIT tablet Take by mouth.    [provider]  clobetasol (TEMOVATE) 0.05 % external solution Apply 1 application topically 2 (two) times daily. Prn flares 07/30/19   Ralene Bathe, MD  clobetasol (TEMOVATE) 0.05 % external solution Mix into CeraVe cream as directed. Apply to aa's rash QD-BID PRN flares. 02/03/21   Ralene Bathe, MD  cyanocobalamin 1000 MCG tablet Take by mouth.    [provider]  estradiol (ESTRACE) 0.5 MG tablet TAKE 1 TABLET EVERY DAY (NEED MD APPOINTMENT) 11/18/19   [provider]  famotidine (PEPCID) 20 MG tablet  10/21/20   [provider]  fluticasone (FLONASE) 50 MCG/ACT nasal spray Place 1 spray into both nostrils 2 (two) times daily. 04/07/20   Cuthriell, Charline Bills, PA-C  HYDROcodone-acetaminophen (NORCO) 5-325 MG tablet Take 1 tablet by mouth every 6 (six) hours as needed for moderate pain. 08/13/21   Paulette Blanch, MD  lisinopril-hydrochlorothiazide (ZESTORETIC) 10-12.5 MG tablet Take 1 tablet by mouth daily. 11/18/19   [provider]  loratadine-pseudoephedrine (CLARITIN-D 24-HOUR) 10-240 MG 24 hr tablet Take 1 tablet by mouth daily.    [provider]  nitrofurantoin, macrocrystal-monohydrate, (MACROBID) 100 MG capsule Take 1 capsule (100 mg total) by mouth 2 (two) times daily. 08/13/21   Paulette Blanch, MD     Allergies  Cefdinir   Family History   Family History  Problem Relation Age of Onset   Breast cancer Neg Hx      Physical Exam  Triage Vital Signs: ED Triage Vitals  Enc Vitals Group     BP 08/13/21 0101 (!) 151/93     Pulse Rate 08/13/21 0101 73     Resp 08/13/21 0101 20     Temp 08/13/21 0101 98.1 F (36.7 C)     Temp Source 08/13/21 0101 Oral     SpO2 08/13/21 0101 96 %     Weight 08/13/21 0101 158 lb (71.7  kg)     Height 08/13/21 0101 '5\' 5"'$  (1.651 m)     Head Circumference --      Peak Flow --      Pain Score 08/13/21 0100 7     Pain Loc --      Pain Edu? --      Excl. in [place]? --     Updated Vital Signs: BP (!) 151/93 (BP Location: Left Arm)   Pulse 73   Temp 98.1 F (36.7 C) (Oral)   Resp 20   Ht '5\' 5"'$  (1.651 m)   Wt 71.7 kg   SpO2 96%   BMI 26.29 kg/m    General: Awake, no distress.  CV:  RRR.  Good peripheral perfusion.  Resp:  Normal effort.  CTAB. Abd:  Mildly tender to suprapubic region without rebound or guarding.  No CVAT.  No distention.  Other:  No vesicles on trunk.   ED Results / Procedures / Treatments  Labs (all labs ordered are listed, but only abnormal  results are displayed) Labs Reviewed  URINALYSIS, ROUTINE W REFLEX MICROSCOPIC - Abnormal; Notable for the following components:      Result Value   Color, Urine YELLOW (*)    APPearance CLOUDY (*)    Hgb urine dipstick LARGE (*)    Protein, ur 100 (*)    Leukocytes,Ua LARGE (*)    RBC / HPF >50 (*)    WBC, UA >50 (*)    All other components within normal limits  URINE CULTURE     EKG  None   RADIOLOGY None   Official radiology report(s): No results found.   PROCEDURES:  Critical Care performed: No  Procedures   MEDICATIONS ORDERED IN ED: Medications  nitrofurantoin (macrocrystal-monohydrate) (MACROBID) capsule 100 mg (has no administration in time range)  phenazopyridine (PYRIDIUM) tablet 200 mg (has no administration in time range)  HYDROcodone-acetaminophen (NORCO/VICODIN) 5-325 MG per tablet 1 tablet (has no administration in time range)     IMPRESSION / MDM / ASSESSMENT AND PLAN / ED COURSE  I reviewed the triage vital signs and the nursing notes.                             76 year old female presenting with urinary frequency, dysuria, hematuria and suprapubic abdominal pain.  Not on anticoagulation.  UA demonstrates large leukocyte positive UTI.  Will add culture.  Low suspicion for sepsis as patient is afebrile, hemodynamically stable and not tachycardic.  Low suspicion for renal colic as patient does not have unilateral flank pain.  Will start Macrobid, Pyridium, Norco as needed for pain.  Will discharge home with these prescriptions as well as as needed Zofran.  Strict return precautions given.  Patient and spouse verbalized understanding agree with plan of care.  FINAL CLINICAL IMPRESSION(S) / ED DIAGNOSES   Final diagnoses:  Hematuria, unspecified type  Cystitis  Dysuria     Rx / DC Orders   ED Discharge Orders          Ordered    nitrofurantoin, macrocrystal-monohydrate, (MACROBID) 100 MG capsule  2 times daily,   Status:  Discontinued         08/13/21 0214    HYDROcodone-acetaminophen (NORCO) 5-325 MG tablet  Every 6 hours PRN,   Status:  Discontinued        08/13/21 0214    ondansetron (ZOFRAN-ODT) 4 MG disintegrating tablet  Every 8 hours PRN  08/13/21 0214    phenazopyridine (PYRIDIUM) 200 MG tablet  3 times daily PRN        08/13/21 0214    nitrofurantoin, macrocrystal-monohydrate, (MACROBID) 100 MG capsule  2 times daily        08/13/21 0215    HYDROcodone-acetaminophen (NORCO) 5-325 MG tablet  Every 6 hours PRN        08/13/21 0215             Note:  This document was prepared using Dragon voice recognition software and may include unintentional dictation errors.   Paulette Blanch, MD 08/13/21 [phone]

## 2021-08-15 LAB — URINE CULTURE: Culture: >=100000 — AB

## 2021-08-18 DIAGNOSIS — H903 Sensorineural hearing loss, bilateral: Secondary | ICD-10-CM | POA: Diagnosis not present

## 2021-08-22 DIAGNOSIS — I10 Essential (primary) hypertension: Secondary | ICD-10-CM | POA: Diagnosis not present

## 2021-08-29 DIAGNOSIS — R5381 Other malaise: Secondary | ICD-10-CM | POA: Diagnosis not present

## 2021-08-29 DIAGNOSIS — R5383 Other fatigue: Secondary | ICD-10-CM | POA: Diagnosis not present

## 2021-08-29 DIAGNOSIS — Z79899 Other long term (current) drug therapy: Secondary | ICD-10-CM | POA: Diagnosis not present

## 2021-08-29 DIAGNOSIS — I10 Essential (primary) hypertension: Secondary | ICD-10-CM | POA: Diagnosis not present

## 2021-08-29 DIAGNOSIS — D7589 Other specified diseases of blood and blood-forming organs: Secondary | ICD-10-CM | POA: Diagnosis not present

## 2021-08-29 DIAGNOSIS — Z0000 Encounter for general adult medical examination without abnormal findings: Secondary | ICD-10-CM | POA: Diagnosis not present

## 2021-08-29 DIAGNOSIS — H9191 Unspecified hearing loss, right ear: Secondary | ICD-10-CM | POA: Diagnosis not present

## 2021-08-29 DIAGNOSIS — N1831 Chronic kidney disease, stage 3a: Secondary | ICD-10-CM | POA: Diagnosis not present

## 2021-09-09 DIAGNOSIS — R5383 Other fatigue: Secondary | ICD-10-CM | POA: Diagnosis not present

## 2021-09-09 DIAGNOSIS — R5381 Other malaise: Secondary | ICD-10-CM | POA: Diagnosis not present

## 2021-09-09 DIAGNOSIS — Z7689 Persons encountering health services in other specified circumstances: Secondary | ICD-10-CM | POA: Diagnosis not present

## 2021-09-18 IMAGING — MR MR LUMBAR SPINE W/O CM
5 series · 31 of 48 positions shown · non-contrast
Comparison: 06/12/2020 and prior.

CLINICAL DATA: Chronic right-sided low back pain with right-sided
sciatica.

EXAM:
MRI LUMBAR SPINE WITHOUT CONTRAST
TECHNIQUE: Multiplanar, multisequence MR imaging of the lumbar spine was
performed. No intravenous contrast was administered.

[Series 5: T2 · sagittal · 4.0mm · 0.81mm/px · 6 of 17 slices shown (1 of 2)]
[im 1/17]
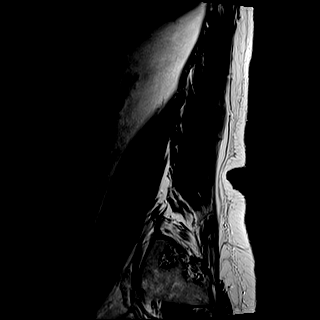
[im 4/17]
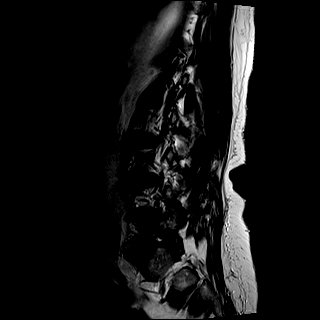
[im 7/17]
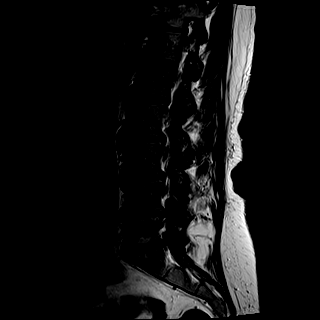
[im 10/17]
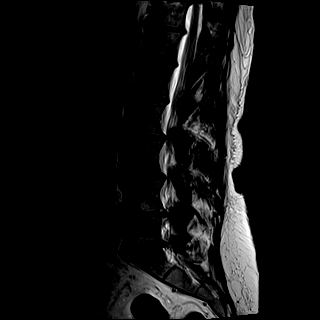
[im 13/17]
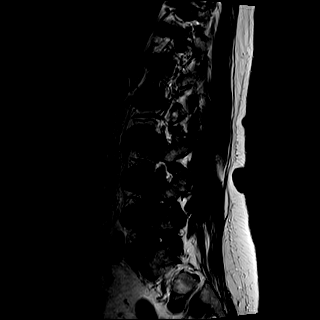
[im 17/17]
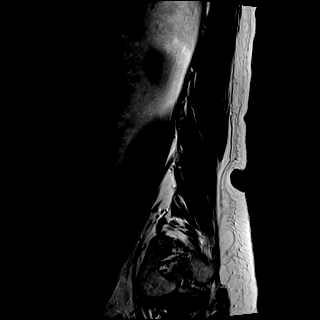

[Series 6: T1 · sagittal · 4.0mm · 0.81mm/px · 6 of 17 slices shown (1 of 2)]
[im 1/17]
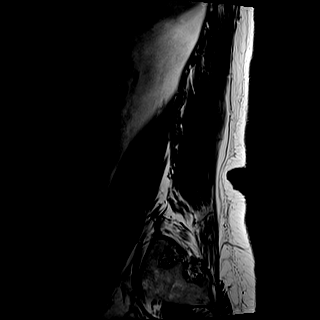
[im 4/17]
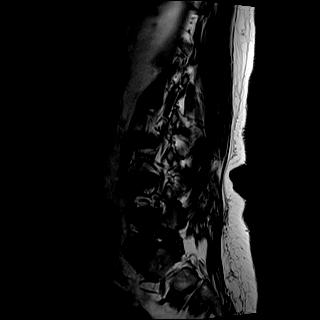
[im 7/17]
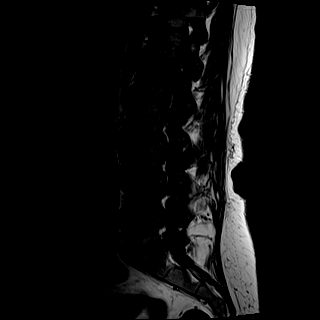
[im 10/17]
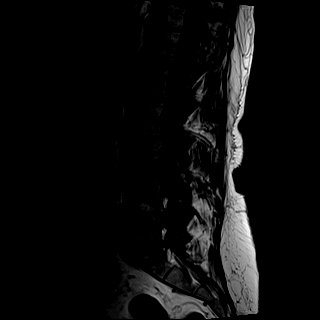
[im 13/17]
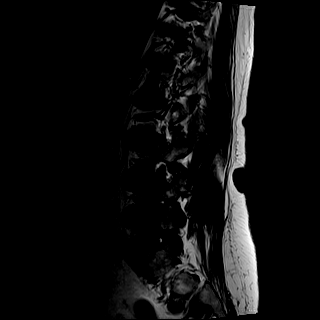
[im 17/17]
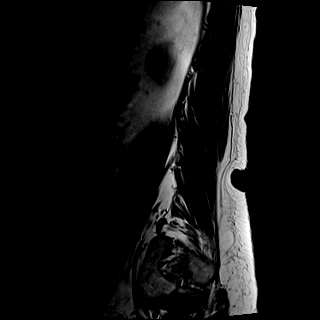

[Series 7: STIR · sagittal · 4.0mm · 0.41mm/px · 1 of 17 slices shown]
[im 1/17]
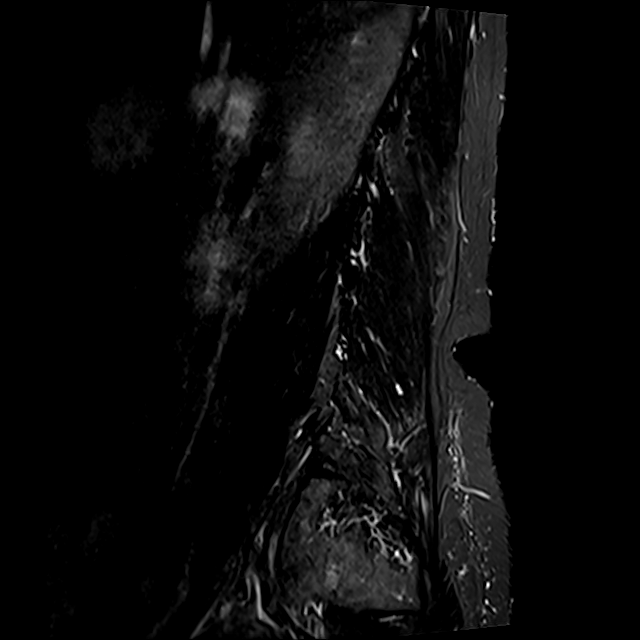

[Series 8: T2 · axial · 4.0mm · 0.78mm/px · z∈[-79,+145]mm · 9 of 40 slices shown (2 of 2)]
[im 1/40]
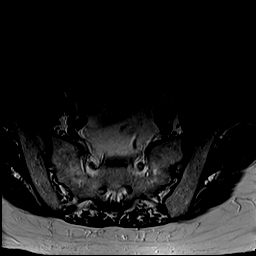
[im 6/40]
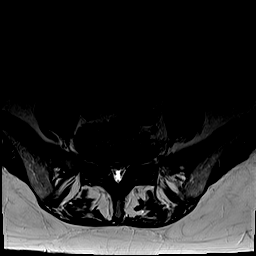
[im 12/40]
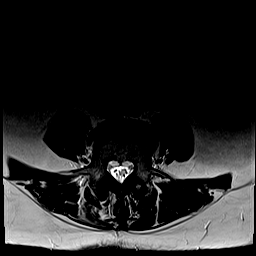
[im 17/40]
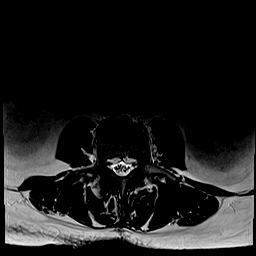
[im 20/40]
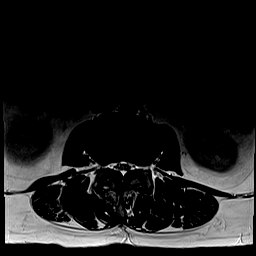
[im 23/40]
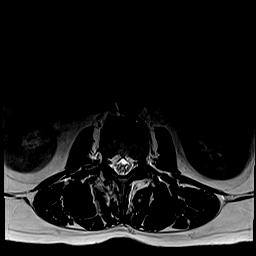
[im 28/40]
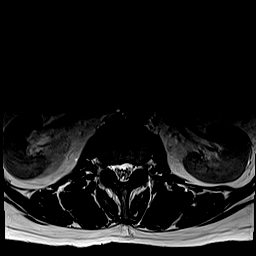
[im 34/40]
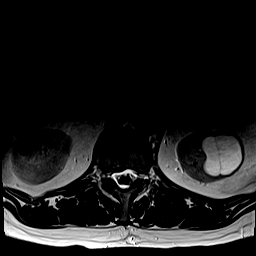
[im 40/40]
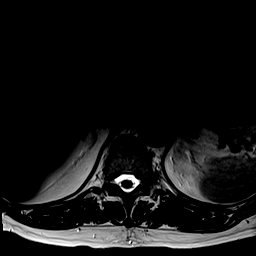

[Series 9: T1 · axial · 4.0mm · 0.39mm/px · z∈[-79,+145]mm · 9 of 40 slices shown (2 of 2)]
[im 1/40]
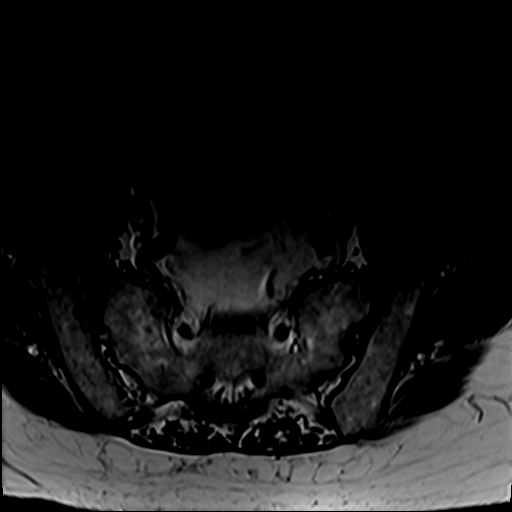
[im 6/40]
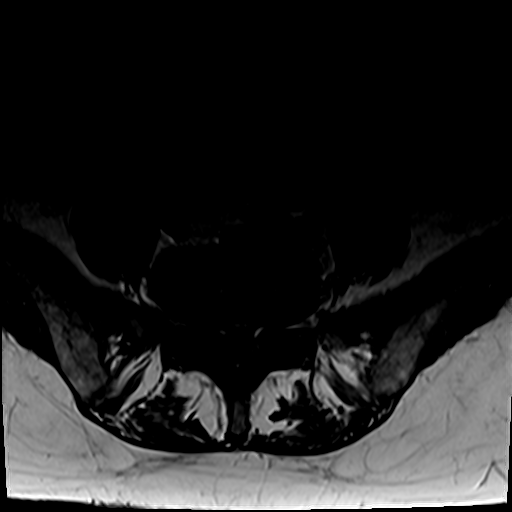
[im 12/40]
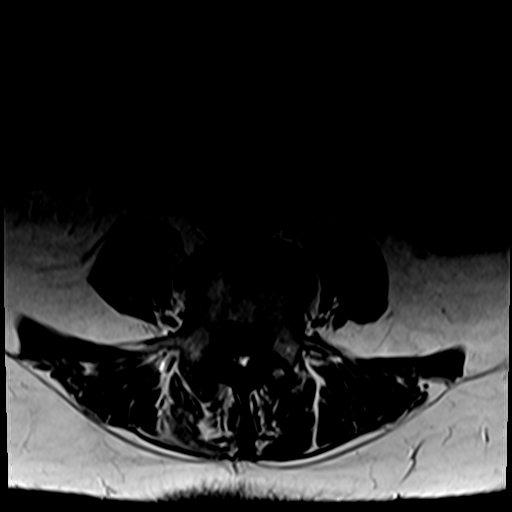
[im 17/40]
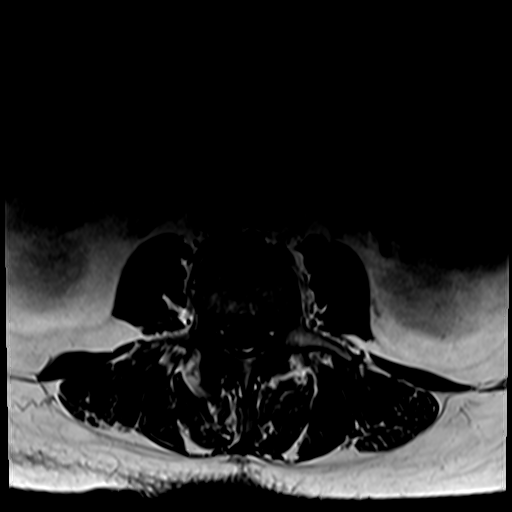
[im 20/40]
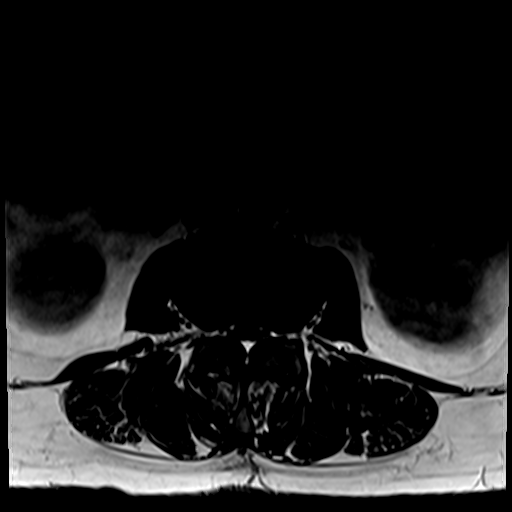
[im 23/40]
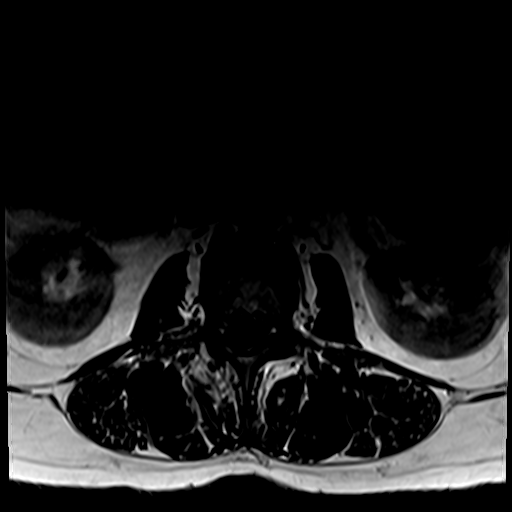
[im 28/40]
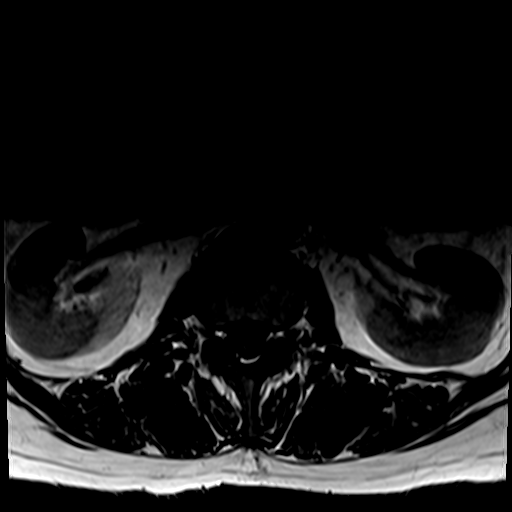
[im 34/40]
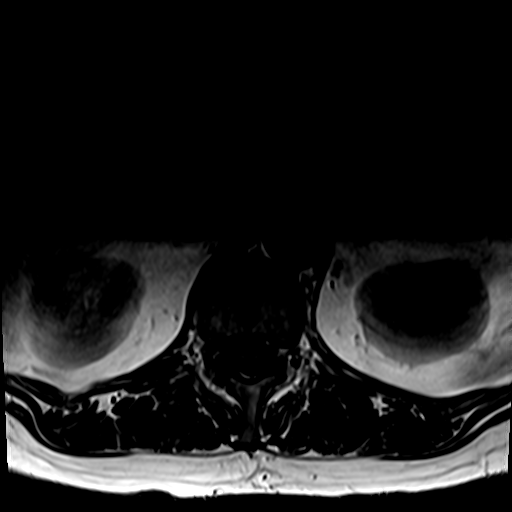
[im 40/40]
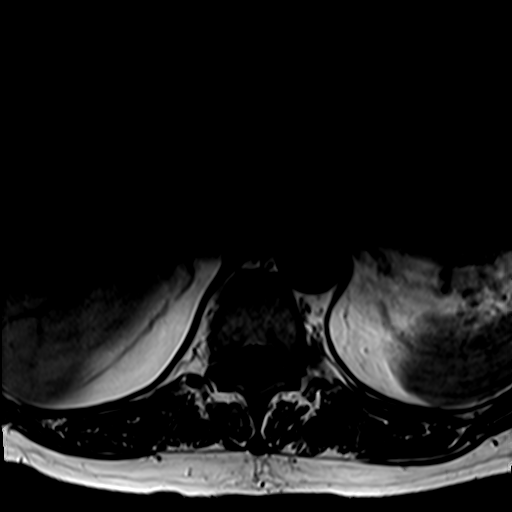

[31 of 48 positions shown; findings below may reference images not displayed]

FINDINGS: Segmentation:  Standard.

Alignment: Straightening of lordosis. Stepwise trace anterolisthesis
at L2-3, L3-4.

Vertebrae: Modic type 2 endplate degenerative changes. Minimal
chronic L1 wedging. No acute/subacute fracture or aggressive osseous
lesion.

Conus medullaris and cauda equina: Conus extends to the L1 level.
Conus and cauda equina appear normal.

Disc levels: Mild desiccation and disc space loss. Prominent dorsal
epidural fat.

T11-12: Mild disc bulge and bilateral facet hypertrophy. Mild spinal
canal and bilateral neural foraminal narrowing.

T12-L1: Minimal disc bulge with shallow right paracentral
protrusion. Bilateral facet hypertrophy. Patent spinal canal and
bilateral neural foramina.

L1-2: Disc bulge, prominent ligamentum flavum and facet degenerative
spurring. Tiny right paracentral protrusion. Mild spinal canal and
bilateral neural foraminal narrowing.

L2-3: Disc bulge with right foraminal and right paracentral
protrusions. Ligamentum flavum thickening and bilateral facet
hypertrophy. Moderate spinal canal and mild bilateral neural
foraminal narrowing.

L3-4: Disc bulge, ligamentum flavum thickening and bilateral facet
hypertrophy. Moderate-to-severe spinal canal and mild bilateral
neural foraminal narrowing.

L4-5: Disc bulge, ligamentum flavum thickening and bilateral facet
hypertrophy. Moderate spinal canal and moderate to severe bilateral
neural foraminal narrowing.

L5-S1: Disc bulge and bilateral facet hypertrophy. Patent spinal
canal. Moderate bilateral neural foraminal narrowing.

Paraspinal and other soft tissues: Bilateral renal cysts.
IMPRESSION: Multilevel spondylosis with superimposed epidural lipomatosis.

Moderate to severe spinal canal narrowing at the L2-L5 levels.

Moderate to severe bilateral L4-5 and bilateral L5-S1 neural
foraminal narrowing.

## 2021-09-29 DIAGNOSIS — R5383 Other fatigue: Secondary | ICD-10-CM | POA: Diagnosis not present

## 2021-09-29 DIAGNOSIS — R5381 Other malaise: Secondary | ICD-10-CM | POA: Diagnosis not present

## 2021-09-29 DIAGNOSIS — H938X3 Other specified disorders of ear, bilateral: Secondary | ICD-10-CM | POA: Diagnosis not present

## 2021-09-29 DIAGNOSIS — R42 Dizziness and giddiness: Secondary | ICD-10-CM | POA: Diagnosis not present

## 2021-09-29 DIAGNOSIS — H9203 Otalgia, bilateral: Secondary | ICD-10-CM | POA: Diagnosis not present

## 2021-10-17 DIAGNOSIS — H903 Sensorineural hearing loss, bilateral: Secondary | ICD-10-CM | POA: Diagnosis not present

## 2021-10-17 DIAGNOSIS — H90A21 Sensorineural hearing loss, unilateral, right ear, with restricted hearing on the contralateral side: Secondary | ICD-10-CM | POA: Diagnosis not present

## 2021-10-19 ENCOUNTER — Other Ambulatory Visit: Payer: Self-pay | Admitting: Student

## 2021-10-19 DIAGNOSIS — H90A21 Sensorineural hearing loss, unilateral, right ear, with restricted hearing on the contralateral side: Secondary | ICD-10-CM

## 2021-11-01 ENCOUNTER — Encounter: Payer: Self-pay | Admitting: Neurosurgery

## 2021-11-01 ENCOUNTER — Ambulatory Visit (INDEPENDENT_AMBULATORY_CARE_PROVIDER_SITE_OTHER): Payer: No Typology Code available for payment source | Admitting: Neurosurgery

## 2021-11-01 ENCOUNTER — Ambulatory Visit
Admission: RE | Admit: 2021-11-01 | Discharge: 2021-11-01 | Disposition: A | Discharge status: Home / Self Care | Payer: No Typology Code available for payment source | Source: Ambulatory Visit | Attending: Neurosurgery | Admitting: Neurosurgery

## 2021-11-01 ENCOUNTER — Ambulatory Visit
Admission: RE | Admit: 2021-11-01 | Discharge: 2021-11-01 | Disposition: A | Discharge status: Home / Self Care | Payer: No Typology Code available for payment source | Attending: Neurosurgery | Admitting: Neurosurgery

## 2021-11-01 VITALS — BP 132/76 | Ht 65.0 in | Wt 159.8 lb

## 2021-11-01 DIAGNOSIS — M545 Low back pain, unspecified: Secondary | ICD-10-CM

## 2021-11-01 DIAGNOSIS — M47816 Spondylosis without myelopathy or radiculopathy, lumbar region: Secondary | ICD-10-CM | POA: Insufficient documentation

## 2021-11-01 DIAGNOSIS — K5900 Constipation, unspecified: Secondary | ICD-10-CM | POA: Diagnosis not present

## 2021-11-01 MED ORDER — METHOCARBAMOL 500 MG PO TABS: 500.0000 mg | ORAL_TABLET | Freq: Three times a day (TID) | ORAL | 0 refills | Status: DC | PRN

## 2021-11-01 NOTE — Progress Notes (Signed)
Follow-up note: Referring Physician:  Maryland Pink, MD [address] [place],  [place] [address]  Primary Physician:  Maryland Pink, MD  Chief Complaint:  acute on chronic low back pain   History of Present Illness: Sharon Watson is a 76 y.o. female who presents with the chief complaint of acute on chronic low back pain for the last 3-4 weeks.  She states she was doing laundry and felt a crack in her back.  She has had bilateral neck pain that starts just above her tailbone and radiates out laterally since then.  While she states it is improved from the initial onset it has persisted and is causing her concern.  She states that it seems to be worse in the morning and improves some throughout the day.  It also seems to exacerbated by bending forward.  She has been taking over-the-counter NSAIDs which does provide some relief.  She denies any radiating pain similar to what she has had in the past with episodes of sciatica.  She does report chronic numbness in her right foot, she denies any new numbness or tingling.  LOV 08/19/20 08/19/2020  Sharon Watson is doing well. She is 100% better with physical therapy.  07/01/20 Sharon Watson is here today with a chief complaint of right sided low back pain with radiation into the right hip and groin. Not radiating into the leg at this time.   She had a substantial flare of pain in late February 2022. She presented to the emergency department where she was worked up and found to have significant lumbar spondylosis. Her symptoms have improved since that time, but she still has trouble particular with walking uphill. She can only walk about 5 minutes before she has pain around her right hip. She has some pain into her groin as well. She can stand, but does have onset of pain after standing for short period of time. Other activities such as bending and twisting also cause her discomfort. Rest and a TENS unit have helped. Massage has  helped.  She has had episodes of sciatica in the past and was successfully helped with physical therapy.  Conservative measures:  Physical therapy: has not participated in since this recent flare Multimodal medical therapy including regular antiinflammatories: hydrocodone, methocarbamol, prednisone, ibuprofen Injections: has not had any epidural steroid injections   Review of Systems:  A 10 point review of systems is negative,  and the pertinent positives and negatives detailed in the HPI.  Past Medical History: Past Medical History:  Diagnosis Date   Hypertension     Past Surgical History: Past Surgical History:  Procedure Laterality Date   ABDOMINAL HYSTERECTOMY     FOOT SURGERY      Allergies: Allergies as of 11/01/2021 - Review Complete 11/01/2021  Allergen Reaction Noted   Cefdinir  02/24/2019    Medications: Outpatient Encounter Medications as of 11/01/2021  Medication Sig   acetaminophen (TYLENOL) 500 MG tablet Take 500 mg by mouth every 6 (six) hours as needed.   Azelastine HCl 137 MCG/SPRAY SOLN SMARTSIG:1-2 Spray(s) Both Nares Twice Daily   Calcium Carbonate-Vitamin D 600-400 MG-UNIT tablet Take by mouth.   clobetasol (TEMOVATE) 0.05 % external solution Apply 1 application topically 2 (two) times daily. Prn flares   clobetasol (TEMOVATE) 0.05 % external solution Mix into CeraVe cream as directed. Apply to aa's rash QD-BID PRN flares.   cyanocobalamin 1000 MCG tablet Take by mouth.   estradiol (ESTRACE) 0.5 MG tablet TAKE  1 TABLET EVERY DAY (NEED MD APPOINTMENT)   famotidine (PEPCID) 20 MG tablet    fluticasone (FLONASE) 50 MCG/ACT nasal spray Place 1 spray into both nostrils 2 (two) times daily.   lisinopril-hydrochlorothiazide (ZESTORETIC) 10-12.5 MG tablet Take 1 tablet by mouth daily.   loratadine-pseudoephedrine (CLARITIN-D 24-HOUR) 10-240 MG 24 hr tablet Take 1 tablet by mouth daily.   [DISCONTINUED] HYDROcodone-acetaminophen (NORCO) 5-325 MG tablet Take 1  tablet by mouth every 6 (six) hours as needed for moderate pain. (Patient not taking: Reported on 11/01/2021)   [DISCONTINUED] nitrofurantoin, macrocrystal-monohydrate, (MACROBID) 100 MG capsule Take 1 capsule (100 mg total) by mouth 2 (two) times daily. (Patient not taking: Reported on 11/01/2021)   [DISCONTINUED] ondansetron (ZOFRAN-ODT) 4 MG disintegrating tablet Take 1 tablet (4 mg total) by mouth every 8 (eight) hours as needed for nausea or vomiting. (Patient not taking: Reported on 11/01/2021)   [DISCONTINUED] phenazopyridine (PYRIDIUM) 200 MG tablet Take 1 tablet (200 mg total) by mouth 3 (three) times daily as needed for pain. (Patient not taking: Reported on 11/01/2021)   No facility-administered encounter medications on file as of 11/01/2021.    Social History: Social History   Tobacco Use   Smoking status: Never   Smokeless tobacco: Never  Substance Use Topics   Alcohol use: Yes    Family Medical History: Family History  Problem Relation Age of Onset   Breast cancer Neg Hx     Exam:  Today's Vitals   11/01/21 1403  BP: 132/76  Weight: 72.5 kg  Height: '5\' 5"'$  (1.651 m)  PainSc: 3   PainLoc: Back   Body mass index is 26.59 kg/m.   General: A&Ox3 ROM of spine: Decreased range of motion with flexion.  Palpation of spine: Nontender.  Strength in the left lower extremity is EHL 5/5, Dorsiflexion 5/5, Plantar flexion 5/5, Hamstring 5/5, Quadricep 5/5, Iliopsoas 5/5. Strength in the right lower extremity is EHL 5/5, Dorsiflexion 5/5, Plantar flexion 5/5, Hamstring 5/5, Quadricep 5/5, Iliopsoas 5/5. Reflexes are 2+ and symmetric at the patella and achilles.   Bilateral lower extremity sensation is intact to light touch except diffuse patchy numbness throughout right foot.  Toes down-going.  Gait is normal.  Imaging: No interval spinal imaging to review today  Assessment and Plan: Sharon Watson is a pleasant 76 y.o. female with acute on chronic lumbosacral complaints.  She  states that this feels different than her previous symptoms and given the fact that she felt a crack in her back and persistent pain since, I would like to obtain lumbar x-rays to rule out any fracture.  Should this be largely normal, we will proceed with conservative management including NSAIDs and I have given her prescription for Robaxin to take as needed.  We discussed medication side effects.  We also discussed the possibility of updating a lumbar MRI although given her lack of radicular symptoms, I suspect this will be largely low yield.  We also discussed the possibility of repeating physical therapy however she would like the results of her x-rays prior to considering this.  I will contact her with the results of her x-rays and further plan of care.  She was encouraged to call the office in the interim should she have any questions or concerns.  She expressed understanding was in agreement with this plan.  I spent a total of 33 minutes in both face-to-face and non-face-to-face activities for this visit on the date of this encounter including review of outside records, review of previous imaging,  review of symptoms, physical exam, discussion of differential diagnosis, documentation, order placement.  Cooper Render PA-C Neurosurgery

## 2021-11-02 ENCOUNTER — Telehealth: Payer: Self-pay

## 2021-11-02 DIAGNOSIS — M545 Low back pain, unspecified: Secondary | ICD-10-CM

## 2021-11-02 NOTE — Telephone Encounter (Signed)
Order has been placed. Please fax.

## 2021-11-02 NOTE — Telephone Encounter (Signed)
-----   Message from Peggyann Shoals sent at 11/02/2021  1:39 PM EDT ----- Regarding: FW: xray results Contact: [phone] Patient was notified of the xray results. She agrees with Andee Poles to start PT and she would like to go to [facility] in [place]. ----- Message ----- From: Loleta Dicker, PA Sent: 11/02/2021   1:33 PM EDT To: Peggyann Shoals Subject: RE: xray results                               Please apologize for me. I reviewed them but was waiting for the radiologist to review them as well which has not been done yet. I do not see any fractures or significant changes from her lumbar xrays last year.  It would be safe to move forward with PT if she would like. Danielle ----- Message ----- From: Peggyann Shoals Sent: 11/02/2021   1:15 PM EDT To: Loleta Dicker, PA Subject: xray results                                   You told her that you were going to call her yesterday at the end of the day with her xrays results. She is aware that you are in surgery so it might be tomorrow before you call her.

## 2021-11-03 ENCOUNTER — Ambulatory Visit
Admission: RE | Admit: 2021-11-03 | Discharge: 2021-11-03 | Disposition: A | Discharge status: Home / Self Care | Payer: No Typology Code available for payment source | Source: Ambulatory Visit | Attending: Student | Admitting: Student

## 2021-11-03 DIAGNOSIS — J341 Cyst and mucocele of nose and nasal sinus: Secondary | ICD-10-CM | POA: Diagnosis not present

## 2021-11-03 DIAGNOSIS — H90A21 Sensorineural hearing loss, unilateral, right ear, with restricted hearing on the contralateral side: Secondary | ICD-10-CM

## 2021-11-03 MED ORDER — GADOBENATE DIMEGLUMINE 529 MG/ML IV SOLN
15.0000 mL | Freq: Once | INTRAVENOUS | Status: AC | PRN
Start: 2021-11-03 — End: 2021-11-03
  Administered 2021-11-03: 15 mL via INTRAVENOUS

## 2021-11-03 NOTE — Telephone Encounter (Signed)
Order faxed to [facility]

## 2021-11-14 DIAGNOSIS — H90A21 Sensorineural hearing loss, unilateral, right ear, with restricted hearing on the contralateral side: Secondary | ICD-10-CM | POA: Diagnosis not present

## 2021-11-19 DIAGNOSIS — B9689 Other specified bacterial agents as the cause of diseases classified elsewhere: Secondary | ICD-10-CM | POA: Diagnosis not present

## 2021-11-19 DIAGNOSIS — J019 Acute sinusitis, unspecified: Secondary | ICD-10-CM | POA: Diagnosis not present

## 2021-11-19 DIAGNOSIS — N760 Acute vaginitis: Secondary | ICD-10-CM | POA: Diagnosis not present

## 2021-12-07 DIAGNOSIS — M545 Low back pain, unspecified: Secondary | ICD-10-CM | POA: Diagnosis not present

## 2021-12-07 DIAGNOSIS — G8929 Other chronic pain: Secondary | ICD-10-CM | POA: Diagnosis not present

## 2021-12-14 DIAGNOSIS — M545 Low back pain, unspecified: Secondary | ICD-10-CM | POA: Diagnosis not present

## 2021-12-14 DIAGNOSIS — G8929 Other chronic pain: Secondary | ICD-10-CM | POA: Diagnosis not present

## 2022-01-11 DIAGNOSIS — R42 Dizziness and giddiness: Secondary | ICD-10-CM | POA: Diagnosis not present

## 2022-01-11 DIAGNOSIS — I10 Essential (primary) hypertension: Secondary | ICD-10-CM | POA: Diagnosis not present

## 2022-01-17 ENCOUNTER — Encounter: Payer: Self-pay | Admitting: Physical Therapy

## 2022-01-17 ENCOUNTER — Ambulatory Visit: Payer: No Typology Code available for payment source | Attending: Family Medicine | Admitting: Physical Therapy

## 2022-01-17 DIAGNOSIS — R2681 Unsteadiness on feet: Secondary | ICD-10-CM | POA: Diagnosis not present

## 2022-01-17 DIAGNOSIS — R42 Dizziness and giddiness: Secondary | ICD-10-CM | POA: Diagnosis not present
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
## 2022-01-23 DIAGNOSIS — H40003 Preglaucoma, unspecified, bilateral: Secondary | ICD-10-CM | POA: Diagnosis not present

## 2022-01-24 ENCOUNTER — Encounter: Payer: Self-pay | Admitting: Physical Therapy

## 2022-01-24 ENCOUNTER — Ambulatory Visit: Payer: No Typology Code available for payment source | Attending: Family Medicine | Admitting: Physical Therapy

## 2022-01-24 DIAGNOSIS — R2681 Unsteadiness on feet: Secondary | ICD-10-CM | POA: Diagnosis not present

## 2022-01-24 DIAGNOSIS — R42 Dizziness and giddiness: Secondary | ICD-10-CM | POA: Diagnosis not present
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
## 2022-01-31 ENCOUNTER — Ambulatory Visit: Payer: No Typology Code available for payment source | Admitting: Physical Therapy
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
## 2022-02-01 ENCOUNTER — Ambulatory Visit: Payer: No Typology Code available for payment source | Admitting: Physical Therapy

## 2022-02-01 ENCOUNTER — Encounter: Payer: Self-pay | Admitting: Physical Therapy

## 2022-02-01 DIAGNOSIS — R42 Dizziness and giddiness: Secondary | ICD-10-CM

## 2022-02-01 DIAGNOSIS — R2681 Unsteadiness on feet: Secondary | ICD-10-CM
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
## 2022-02-07 ENCOUNTER — Ambulatory Visit: Payer: No Typology Code available for payment source | Admitting: Physical Therapy

## 2022-02-07 ENCOUNTER — Encounter: Payer: Self-pay | Admitting: Physical Therapy

## 2022-02-07 DIAGNOSIS — R42 Dizziness and giddiness: Secondary | ICD-10-CM | POA: Diagnosis not present

## 2022-02-07 DIAGNOSIS — R2681 Unsteadiness on feet: Secondary | ICD-10-CM
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
## 2022-02-14 ENCOUNTER — Ambulatory Visit: Payer: No Typology Code available for payment source | Admitting: Physical Therapy

## 2022-02-14 ENCOUNTER — Encounter: Payer: Self-pay | Admitting: Physical Therapy

## 2022-02-14 DIAGNOSIS — R42 Dizziness and giddiness: Secondary | ICD-10-CM

## 2022-02-14 DIAGNOSIS — R944 Abnormal results of kidney function studies: Secondary | ICD-10-CM | POA: Diagnosis not present

## 2022-02-14 DIAGNOSIS — I10 Essential (primary) hypertension: Secondary | ICD-10-CM | POA: Diagnosis not present

## 2022-02-14 DIAGNOSIS — R2681 Unsteadiness on feet: Secondary | ICD-10-CM

## 2022-02-16 DIAGNOSIS — J329 Chronic sinusitis, unspecified: Secondary | ICD-10-CM | POA: Diagnosis not present

## 2022-02-16 DIAGNOSIS — J309 Allergic rhinitis, unspecified: Secondary | ICD-10-CM | POA: Diagnosis not present

## 2022-02-20 ENCOUNTER — Ambulatory Visit: Payer: Medicare HMO | Admitting: Dermatology

## 2022-02-28 ENCOUNTER — Ambulatory Visit: Payer: No Typology Code available for payment source | Admitting: Physical Therapy

## 2022-03-07 ENCOUNTER — Encounter: Payer: Self-pay | Admitting: Physical Therapy

## 2022-03-07 ENCOUNTER — Ambulatory Visit: Payer: No Typology Code available for payment source | Attending: Family Medicine | Admitting: Physical Therapy

## 2022-03-07 DIAGNOSIS — R42 Dizziness and giddiness: Secondary | ICD-10-CM | POA: Diagnosis not present

## 2022-03-07 DIAGNOSIS — R2681 Unsteadiness on feet: Secondary | ICD-10-CM | POA: Insufficient documentation
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
## 2022-03-08 DIAGNOSIS — M9973 Connective tissue and disc stenosis of intervertebral foramina of lumbar region: Secondary | ICD-10-CM | POA: Diagnosis not present

## 2022-03-08 DIAGNOSIS — M5431 Sciatica, right side: Secondary | ICD-10-CM | POA: Diagnosis not present

## 2022-03-08 DIAGNOSIS — M25561 Pain in right knee: Secondary | ICD-10-CM | POA: Diagnosis not present

## 2022-03-21 ENCOUNTER — Ambulatory Visit: Payer: No Typology Code available for payment source | Attending: Family Medicine | Admitting: Physical Therapy

## 2022-03-21 DIAGNOSIS — R6889 Other general symptoms and signs: Secondary | ICD-10-CM | POA: Diagnosis not present

## 2022-03-21 DIAGNOSIS — R42 Dizziness and giddiness: Secondary | ICD-10-CM | POA: Insufficient documentation

## 2022-03-21 DIAGNOSIS — R2681 Unsteadiness on feet: Secondary | ICD-10-CM | POA: Insufficient documentation

## 2022-03-21 DIAGNOSIS — R051 Acute cough: Secondary | ICD-10-CM | POA: Diagnosis not present

## 2022-03-21 DIAGNOSIS — Z03818 Encounter for observation for suspected exposure to other biological agents ruled out: Secondary | ICD-10-CM | POA: Diagnosis not present

## 2022-03-27 DIAGNOSIS — J40 Bronchitis, not specified as acute or chronic: Secondary | ICD-10-CM | POA: Diagnosis not present

## 2022-03-27 DIAGNOSIS — R062 Wheezing: Secondary | ICD-10-CM | POA: Diagnosis not present

## 2022-03-27 DIAGNOSIS — Z03818 Encounter for observation for suspected exposure to other biological agents ruled out: Secondary | ICD-10-CM | POA: Diagnosis not present

## 2022-03-28 ENCOUNTER — Ambulatory Visit: Payer: No Typology Code available for payment source | Admitting: Physical Therapy

## 2022-04-04 ENCOUNTER — Encounter: Payer: Self-pay | Admitting: Physical Therapy

## 2022-04-04 ENCOUNTER — Ambulatory Visit: Payer: No Typology Code available for payment source | Admitting: Physical Therapy

## 2022-04-04 DIAGNOSIS — R2681 Unsteadiness on feet: Secondary | ICD-10-CM | POA: Diagnosis not present

## 2022-04-04 DIAGNOSIS — R42 Dizziness and giddiness: Secondary | ICD-10-CM | POA: Diagnosis not present
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
## 2022-04-06 ENCOUNTER — Ambulatory Visit: Payer: No Typology Code available for payment source | Admitting: Physical Therapy

## 2022-04-11 ENCOUNTER — Ambulatory Visit: Payer: No Typology Code available for payment source | Admitting: Physical Therapy

## 2022-04-11 ENCOUNTER — Encounter: Payer: Self-pay | Admitting: Physical Therapy

## 2022-04-11 DIAGNOSIS — R42 Dizziness and giddiness: Secondary | ICD-10-CM

## 2022-04-11 DIAGNOSIS — R2681 Unsteadiness on feet: Secondary | ICD-10-CM
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
## 2022-04-13 ENCOUNTER — Ambulatory Visit: Payer: No Typology Code available for payment source | Admitting: Physical Therapy

## 2022-04-18 ENCOUNTER — Ambulatory Visit: Payer: No Typology Code available for payment source | Admitting: Physical Therapy

## 2022-04-20 ENCOUNTER — Ambulatory Visit: Payer: No Typology Code available for payment source | Admitting: Physical Therapy

## 2022-04-20 ENCOUNTER — Ambulatory Visit: Payer: Medicare HMO | Admitting: Dermatology

## 2022-04-25 ENCOUNTER — Ambulatory Visit: Payer: No Typology Code available for payment source | Admitting: Physical Therapy

## 2022-04-27 ENCOUNTER — Ambulatory Visit: Payer: No Typology Code available for payment source | Admitting: Physical Therapy

## 2022-04-28 ENCOUNTER — Ambulatory Visit: Payer: No Typology Code available for payment source | Admitting: Physical Therapy

## 2022-05-02 ENCOUNTER — Ambulatory Visit: Payer: No Typology Code available for payment source | Admitting: Physical Therapy

## 2022-05-04 ENCOUNTER — Ambulatory Visit: Payer: No Typology Code available for payment source | Admitting: Physical Therapy

## 2022-05-05 ENCOUNTER — Ambulatory Visit: Payer: No Typology Code available for payment source | Admitting: Physical Therapy

## 2022-05-15 DIAGNOSIS — H8101 Meniere's disease, right ear: Secondary | ICD-10-CM | POA: Diagnosis not present

## 2022-05-16 DIAGNOSIS — M5416 Radiculopathy, lumbar region: Secondary | ICD-10-CM | POA: Diagnosis not present

## 2022-05-16 DIAGNOSIS — M48062 Spinal stenosis, lumbar region with neurogenic claudication: Secondary | ICD-10-CM | POA: Diagnosis not present

## 2022-06-13 DIAGNOSIS — M48062 Spinal stenosis, lumbar region with neurogenic claudication: Secondary | ICD-10-CM | POA: Diagnosis not present

## 2022-06-13 DIAGNOSIS — M5136 Other intervertebral disc degeneration, lumbar region: Secondary | ICD-10-CM | POA: Diagnosis not present

## 2022-06-13 DIAGNOSIS — M5416 Radiculopathy, lumbar region: Secondary | ICD-10-CM | POA: Diagnosis not present

## 2022-06-22 ENCOUNTER — Ambulatory Visit: Payer: Medicare HMO | Admitting: Dermatology

## 2022-07-24 DIAGNOSIS — H40003 Preglaucoma, unspecified, bilateral: Secondary | ICD-10-CM | POA: Diagnosis not present

## 2022-07-31 ENCOUNTER — Other Ambulatory Visit: Payer: Self-pay | Admitting: Obstetrics and Gynecology

## 2022-07-31 DIAGNOSIS — Z1272 Encounter for screening for malignant neoplasm of vagina: Secondary | ICD-10-CM | POA: Diagnosis not present

## 2022-07-31 DIAGNOSIS — Z1231 Encounter for screening mammogram for malignant neoplasm of breast: Secondary | ICD-10-CM

## 2022-07-31 DIAGNOSIS — Z01419 Encounter for gynecological examination (general) (routine) without abnormal findings: Secondary | ICD-10-CM | POA: Diagnosis not present

## 2022-07-31 DIAGNOSIS — Z1331 Encounter for screening for depression: Secondary | ICD-10-CM | POA: Diagnosis not present

## 2022-08-18 DIAGNOSIS — H40003 Preglaucoma, unspecified, bilateral: Secondary | ICD-10-CM | POA: Diagnosis not present

## 2022-08-18 DIAGNOSIS — H353131 Nonexudative age-related macular degeneration, bilateral, early dry stage: Secondary | ICD-10-CM | POA: Diagnosis not present

## 2022-08-18 DIAGNOSIS — H43813 Vitreous degeneration, bilateral: Secondary | ICD-10-CM | POA: Diagnosis not present

## 2022-08-18 DIAGNOSIS — Z961 Presence of intraocular lens: Secondary | ICD-10-CM | POA: Diagnosis not present

## 2022-09-26 DIAGNOSIS — B9689 Other specified bacterial agents as the cause of diseases classified elsewhere: Secondary | ICD-10-CM | POA: Diagnosis not present

## 2022-09-26 DIAGNOSIS — J329 Chronic sinusitis, unspecified: Secondary | ICD-10-CM | POA: Diagnosis not present

## 2022-09-26 DIAGNOSIS — B3731 Acute candidiasis of vulva and vagina: Secondary | ICD-10-CM | POA: Diagnosis not present

## 2022-09-26 DIAGNOSIS — Z03818 Encounter for observation for suspected exposure to other biological agents ruled out: Secondary | ICD-10-CM | POA: Diagnosis not present

## 2022-10-03 ENCOUNTER — Ambulatory Visit
Admission: RE | Admit: 2022-10-03 | Discharge: 2022-10-03 | Disposition: A | Discharge status: Home / Self Care | Payer: No Typology Code available for payment source | Source: Ambulatory Visit | Attending: Obstetrics and Gynecology | Admitting: Obstetrics and Gynecology

## 2022-10-03 DIAGNOSIS — Z1231 Encounter for screening mammogram for malignant neoplasm of breast: Secondary | ICD-10-CM | POA: Diagnosis not present

## 2022-10-03 DIAGNOSIS — M5416 Radiculopathy, lumbar region: Secondary | ICD-10-CM | POA: Diagnosis not present

## 2022-10-03 DIAGNOSIS — M5136 Other intervertebral disc degeneration, lumbar region: Secondary | ICD-10-CM | POA: Diagnosis not present

## 2022-10-03 DIAGNOSIS — M48062 Spinal stenosis, lumbar region with neurogenic claudication: Secondary | ICD-10-CM | POA: Diagnosis not present

## 2022-10-12 ENCOUNTER — Ambulatory Visit: Payer: Medicare HMO | Admitting: Dermatology

## 2022-10-24 DIAGNOSIS — M5416 Radiculopathy, lumbar region: Secondary | ICD-10-CM | POA: Diagnosis not present

## 2022-10-24 DIAGNOSIS — M48062 Spinal stenosis, lumbar region with neurogenic claudication: Secondary | ICD-10-CM | POA: Diagnosis not present

## 2022-11-10 DIAGNOSIS — M75111 Incomplete rotator cuff tear or rupture of right shoulder, not specified as traumatic: Secondary | ICD-10-CM | POA: Diagnosis not present

## 2022-11-15 DIAGNOSIS — M5136 Other intervertebral disc degeneration, lumbar region: Secondary | ICD-10-CM | POA: Diagnosis not present

## 2022-11-15 DIAGNOSIS — M48062 Spinal stenosis, lumbar region with neurogenic claudication: Secondary | ICD-10-CM | POA: Diagnosis not present

## 2022-11-15 DIAGNOSIS — M5416 Radiculopathy, lumbar region: Secondary | ICD-10-CM | POA: Diagnosis not present

## 2022-11-30 DIAGNOSIS — H8101 Meniere's disease, right ear: Secondary | ICD-10-CM | POA: Diagnosis not present

## 2022-11-30 DIAGNOSIS — J301 Allergic rhinitis due to pollen: Secondary | ICD-10-CM | POA: Diagnosis not present

## 2022-12-06 DIAGNOSIS — Z03818 Encounter for observation for suspected exposure to other biological agents ruled out: Secondary | ICD-10-CM | POA: Diagnosis not present

## 2022-12-06 DIAGNOSIS — B9689 Other specified bacterial agents as the cause of diseases classified elsewhere: Secondary | ICD-10-CM | POA: Diagnosis not present

## 2022-12-06 DIAGNOSIS — J019 Acute sinusitis, unspecified: Secondary | ICD-10-CM | POA: Diagnosis not present

## 2022-12-13 DIAGNOSIS — M25511 Pain in right shoulder: Secondary | ICD-10-CM | POA: Diagnosis not present

## 2022-12-13 DIAGNOSIS — G8929 Other chronic pain: Secondary | ICD-10-CM | POA: Diagnosis not present

## 2022-12-18 DIAGNOSIS — R300 Dysuria: Secondary | ICD-10-CM | POA: Diagnosis not present

## 2023-01-30 DIAGNOSIS — M48062 Spinal stenosis, lumbar region with neurogenic claudication: Secondary | ICD-10-CM | POA: Diagnosis not present

## 2023-01-30 DIAGNOSIS — M5416 Radiculopathy, lumbar region: Secondary | ICD-10-CM | POA: Diagnosis not present

## 2023-02-12 DIAGNOSIS — M75111 Incomplete rotator cuff tear or rupture of right shoulder, not specified as traumatic: Secondary | ICD-10-CM | POA: Diagnosis not present

## 2023-02-13 ENCOUNTER — Other Ambulatory Visit: Payer: Self-pay | Admitting: Orthopedic Surgery

## 2023-02-13 DIAGNOSIS — M75111 Incomplete rotator cuff tear or rupture of right shoulder, not specified as traumatic: Secondary | ICD-10-CM

## 2023-02-27 ENCOUNTER — Other Ambulatory Visit: Payer: No Typology Code available for payment source

## 2023-02-28 ENCOUNTER — Ambulatory Visit
Admission: RE | Admit: 2023-02-28 | Discharge: 2023-02-28 | Disposition: A | Discharge status: Home / Self Care | Payer: No Typology Code available for payment source | Source: Ambulatory Visit | Attending: Orthopedic Surgery | Admitting: Orthopedic Surgery

## 2023-02-28 DIAGNOSIS — M75111 Incomplete rotator cuff tear or rupture of right shoulder, not specified as traumatic: Secondary | ICD-10-CM | POA: Insufficient documentation

## 2023-02-28 DIAGNOSIS — M25511 Pain in right shoulder: Secondary | ICD-10-CM | POA: Diagnosis not present

## 2023-02-28 DIAGNOSIS — S46811A Strain of other muscles, fascia and tendons at shoulder and upper arm level, right arm, initial encounter: Secondary | ICD-10-CM | POA: Diagnosis not present

## 2023-02-28 DIAGNOSIS — M129 Arthropathy, unspecified: Secondary | ICD-10-CM | POA: Diagnosis not present

## 2023-02-28 DIAGNOSIS — M7581 Other shoulder lesions, right shoulder: Secondary | ICD-10-CM | POA: Diagnosis not present

## 2023-03-19 DIAGNOSIS — B9689 Other specified bacterial agents as the cause of diseases classified elsewhere: Secondary | ICD-10-CM | POA: Diagnosis not present

## 2023-03-19 DIAGNOSIS — Z03818 Encounter for observation for suspected exposure to other biological agents ruled out: Secondary | ICD-10-CM | POA: Diagnosis not present

## 2023-03-19 DIAGNOSIS — J329 Chronic sinusitis, unspecified: Secondary | ICD-10-CM | POA: Diagnosis not present

## 2023-03-21 DIAGNOSIS — M75111 Incomplete rotator cuff tear or rupture of right shoulder, not specified as traumatic: Secondary | ICD-10-CM

## 2023-03-21 HISTORY — DX: Incomplete rotator cuff tear or rupture of right shoulder, not specified as traumatic: M75.111

## 2023-03-29 DIAGNOSIS — H353131 Nonexudative age-related macular degeneration, bilateral, early dry stage: Secondary | ICD-10-CM | POA: Diagnosis not present

## 2023-03-29 DIAGNOSIS — H40003 Preglaucoma, unspecified, bilateral: Secondary | ICD-10-CM | POA: Diagnosis not present

## 2023-04-09 DIAGNOSIS — M7521 Bicipital tendinitis, right shoulder: Secondary | ICD-10-CM | POA: Diagnosis not present

## 2023-04-09 DIAGNOSIS — M7581 Other shoulder lesions, right shoulder: Secondary | ICD-10-CM | POA: Diagnosis not present

## 2023-04-09 DIAGNOSIS — M75111 Incomplete rotator cuff tear or rupture of right shoulder, not specified as traumatic: Secondary | ICD-10-CM | POA: Diagnosis not present

## 2023-04-18 ENCOUNTER — Other Ambulatory Visit: Payer: Self-pay | Admitting: Surgery

## 2023-04-19 ENCOUNTER — Encounter
Admission: RE | Admit: 2023-04-19 | Discharge: 2023-04-19 | Disposition: A | Discharge status: Home / Self Care | Payer: HMO | Source: Ambulatory Visit | Attending: Surgery | Admitting: Surgery

## 2023-04-19 ENCOUNTER — Other Ambulatory Visit: Payer: Self-pay

## 2023-04-19 VITALS — Ht 65.0 in | Wt 156.0 lb

## 2023-04-19 DIAGNOSIS — Z01810 Encounter for preprocedural cardiovascular examination: Secondary | ICD-10-CM

## 2023-04-19 DIAGNOSIS — Z01812 Encounter for preprocedural laboratory examination: Secondary | ICD-10-CM

## 2023-04-19 DIAGNOSIS — I10 Essential (primary) hypertension: Secondary | ICD-10-CM

## 2023-04-19 HISTORY — DX: Anemia, unspecified: D64.9

## 2023-04-19 HISTORY — DX: Unspecified cataract: H26.9

## 2023-04-19 HISTORY — DX: Pneumonia, unspecified organism: J18.9

## 2023-04-19 HISTORY — DX: Chronic kidney disease, stage 3a: N18.31

## 2023-04-19 HISTORY — DX: Other intervertebral disc displacement, lumbar region: M51.26

## 2023-04-19 HISTORY — DX: Polyp of colon: K63.5

## 2023-04-19 HISTORY — DX: Dizziness and giddiness: R42

## 2023-04-19 HISTORY — DX: Nonrheumatic mitral (valve) insufficiency: I34.0

## 2023-04-19 HISTORY — DX: Essential (primary) hypertension: I10

## 2023-04-19 HISTORY — DX: Endometriosis, unspecified: N80.9

## 2023-04-19 HISTORY — DX: Allergic rhinitis, unspecified: J30.9

## 2023-04-19 HISTORY — DX: Chronic obstructive pulmonary disease, unspecified: J44.9

## 2023-04-19 HISTORY — DX: Unspecified asthma, uncomplicated: J45.909

## 2023-04-19 HISTORY — DX: Meniere's disease, unspecified ear: H81.09

## 2023-04-19 NOTE — Patient Instructions (Addendum)
Your procedure is scheduled on: Wednesday, February 5 Report to the Registration Desk on the 1st floor of the [facility] To find out your arrival time, please call [phone] between 1PM - 3PM on: Tuesday, February 4 If your arrival time is 6:00 am, do not arrive before that time as the [facility] entrance doors do not open until 6:00 am.  REMEMBER: Instructions that are not followed completely may result in serious medical risk, up to and including death; or upon the discretion of your surgeon and anesthesiologist your surgery may need to be rescheduled.  Do not eat food after midnight the night before surgery.  No gum chewing or hard candies.  You may however, drink CLEAR liquids up to 2 hours before you are scheduled to arrive for your surgery. Do not drink anything within 2 hours of your scheduled arrival time.  Clear liquids include: - water  - apple juice without pulp - gatorade (not RED colors) - black coffee or tea (Do NOT add milk or creamers to the coffee or tea) Do NOT drink anything that is not on this list.  In addition, your doctor has ordered for you to drink the provided:  Ensure Pre-Surgery Clear Carbohydrate Drink  Drinking this carbohydrate drink up to two hours before surgery helps to reduce insulin resistance and improve patient outcomes. Please complete drinking 2 hours before scheduled arrival time.  One week prior to surgery: starting today, January 30 Stop Anti-inflammatories (NSAIDS) such as Advil, Aleve, Ibuprofen, Motrin, Naproxen, Naprosyn and Aspirin based products such as Excedrin, Goody's Powder, BC Powder. Stop ANY OVER THE COUNTER supplements until after surgery. Stop vitamin C, calcium, motrin, multiple vitamins, probiotic.  You may however, continue to take Tylenol if needed for pain up until the day of surgery.  Continue taking all of your other prescription medications up until the day of surgery.  ON THE DAY OF SURGERY DO NOT TAKE ANY  MEDICATIONS   No Alcohol for 24 hours before or after surgery.  No Smoking including e-cigarettes for 24 hours before surgery.  No chewable tobacco products for at least 6 hours before surgery.  No nicotine patches on the day of surgery.  Do not use any "recreational" drugs for at least a week (preferably 2 weeks) before your surgery.  Please be advised that the combination of cocaine and anesthesia may have negative outcomes, up to and including death. If you test positive for cocaine, your surgery will be cancelled.  On the morning of surgery brush your teeth with toothpaste and water, you may rinse your mouth with mouthwash if you wish. Do not swallow any toothpaste or mouthwash.  Use CHG Soap as directed on instruction sheet.  Do not wear jewelry, make-up, hairpins, clips or nail polish.  For welded (permanent) jewelry: bracelets, anklets, waist bands, etc.  Please have this removed prior to surgery.  If it is not removed, there is a chance that hospital personnel will need to cut it off on the day of surgery.  Do not wear lotions, powders, or perfumes. No deodorant.   Do not shave body hair from the neck down 48 hours before surgery.  Contact lenses, hearing aids and dentures may not be worn into surgery.  Do not bring valuables to the hospital. [facility] is not responsible for any missing/lost belongings or valuables.   Notify your doctor if there is any change in your medical condition (cold, fever, infection).  Wear comfortable clothing (specific to your surgery type)  to the hospital.  After surgery, you can help prevent lung complications by doing breathing exercises.  Take deep breaths and cough every 1-2 hours. Your doctor may order a device called an Incentive Spirometer to help you take deep breaths.  If you are being discharged the day of surgery, you will not be allowed to drive home. You will need a responsible individual to drive you home and stay with you for  24 hours after surgery.   If you are taking public transportation, you will need to have a responsible individual with you.  Please call the Pre-admissions Testing Dept. at [phone] if you have any questions about these instructions.  Surgery Visitation Policy:  Patients having surgery or a procedure may have two visitors.  Children under the age of 34 must have an adult with them who is not the patient.  Temporary Visitor Restrictions Due to increasing cases of flu, RSV and COVID-19: Children ages 51 and under will not be able to visit patients in [facility] hospitals under most circumstances.      Preparing for Surgery with CHLORHEXIDINE GLUCONATE (CHG) Soap  Chlorhexidine Gluconate (CHG) Soap  o An antiseptic cleaner that kills germs and bonds with the skin to continue killing germs even after washing  o Used for showering the night before surgery and morning of surgery  Before surgery, you can play an important role by reducing the number of germs on your skin.  CHG (Chlorhexidine gluconate) soap is an antiseptic cleanser which kills germs and bonds with the skin to continue killing germs even after washing.  Please do not use if you have an allergy to CHG or antibacterial soaps. If your skin becomes reddened/irritated stop using the CHG.  1. Shower the NIGHT BEFORE SURGERY and the MORNING OF SURGERY with CHG soap.  2. If you choose to wash your hair, wash your hair first as usual with your normal shampoo.  3. After shampooing, rinse your hair and body thoroughly to remove the shampoo.  4. Use CHG as you would any other liquid soap. You can apply CHG directly to the skin and wash gently with a scrungie or a clean washcloth.  5. Apply the CHG soap to your body only from the neck down. Do not use on open wounds or open sores. Avoid contact with your eyes, ears, mouth, and genitals (private parts). Wash face and genitals (private parts) with your normal soap.  6.  Wash thoroughly, paying special attention to the area where your surgery will be performed.  7. Thoroughly rinse your body with warm water.  8. Do not shower/wash with your normal soap after using and rinsing off the CHG soap.  9. Pat yourself dry with a clean towel.  10. Wear clean pajamas to bed the night before surgery.  12. Place clean sheets on your bed the night of your first shower and do not sleep with pets.  13. Shower again with the CHG soap on the day of surgery prior to arriving at the hospital.  14. Do not apply any deodorants/lotions/powders.  15. Please wear clean clothes to the hospital.

## 2023-04-20 ENCOUNTER — Encounter
Admission: RE | Admit: 2023-04-20 | Discharge: 2023-04-20 | Disposition: A | Discharge status: Home / Self Care | Payer: HMO | Source: Ambulatory Visit | Attending: Surgery | Admitting: Surgery

## 2023-04-20 DIAGNOSIS — I10 Essential (primary) hypertension: Secondary | ICD-10-CM | POA: Diagnosis not present

## 2023-04-20 DIAGNOSIS — Z01818 Encounter for other preprocedural examination: Secondary | ICD-10-CM | POA: Insufficient documentation

## 2023-04-20 DIAGNOSIS — Z01812 Encounter for preprocedural laboratory examination: Secondary | ICD-10-CM

## 2023-04-20 DIAGNOSIS — Z01810 Encounter for preprocedural cardiovascular examination: Secondary | ICD-10-CM

## 2023-04-20 LAB — BASIC METABOLIC PANEL
Anion gap: 10 (ref 5–15)
BUN: 22 mg/dL (ref 8–23)
CO2: 26 mmol/L (ref 22–32)
Calcium: 9.4 mg/dL (ref 8.9–10.3)
Chloride: 105 mmol/L (ref 98–111)
Creatinine, Ser: 0.94 mg/dL (ref 0.44–1.00)
GFR, Estimated: >60 mL/min (ref >60)
Glucose, Bld: 98 mg/dL (ref 70–99)
Potassium: 4 mmol/L (ref 3.5–5.1)
Sodium: 141 mmol/L (ref 135–145)

## 2023-04-20 LAB — CBC
HCT: 34.3 % — ABNORMAL LOW (ref 36.0–46.0)
Hemoglobin: 11.7 g/dL — ABNORMAL LOW (ref 12.0–15.0)
MCH: 32.5 pg (ref 26.0–34.0)
MCHC: 34.1 g/dL (ref 30.0–36.0)
MCV: 95.3 fL (ref 80.0–100.0)
Platelets: 392 10*3/uL (ref 150–400)
RBC: 3.6 MIL/uL — ABNORMAL LOW (ref 3.87–5.11)
RDW: 12.4 % (ref 11.5–15.5)
WBC: 5.8 10*3/uL (ref 4.0–10.5)
nRBC: 0 % (ref 0.0–0.2)

## 2023-04-25 ENCOUNTER — Ambulatory Visit
Admission: RE | Admit: 2023-04-25 | Discharge: 2023-04-25 | Disposition: A | Discharge status: Home / Self Care | Payer: HMO | Attending: Surgery | Admitting: Surgery

## 2023-04-25 ENCOUNTER — Ambulatory Visit: Payer: HMO | Admitting: General Practice

## 2023-04-25 ENCOUNTER — Other Ambulatory Visit: Payer: Self-pay

## 2023-04-25 ENCOUNTER — Encounter: Payer: Self-pay | Admitting: Surgery

## 2023-04-25 ENCOUNTER — Encounter
Admission: RE | Disposition: A | Discharge status: Home / Self Care | Payer: Self-pay | Source: Home / Self Care | Attending: Surgery

## 2023-04-25 ENCOUNTER — Ambulatory Visit: Payer: Self-pay | Admitting: Urgent Care

## 2023-04-25 ENCOUNTER — Ambulatory Visit: Payer: HMO

## 2023-04-25 DIAGNOSIS — Z86711 Personal history of pulmonary embolism: Secondary | ICD-10-CM | POA: Diagnosis not present

## 2023-04-25 DIAGNOSIS — M75111 Incomplete rotator cuff tear or rupture of right shoulder, not specified as traumatic: Secondary | ICD-10-CM | POA: Diagnosis not present

## 2023-04-25 DIAGNOSIS — M7521 Bicipital tendinitis, right shoulder: Secondary | ICD-10-CM | POA: Insufficient documentation

## 2023-04-25 DIAGNOSIS — M25811 Other specified joint disorders, right shoulder: Secondary | ICD-10-CM | POA: Insufficient documentation

## 2023-04-25 DIAGNOSIS — M75121 Complete rotator cuff tear or rupture of right shoulder, not specified as traumatic: Secondary | ICD-10-CM | POA: Diagnosis not present

## 2023-04-25 DIAGNOSIS — G8918 Other acute postprocedural pain: Secondary | ICD-10-CM | POA: Diagnosis not present

## 2023-04-25 DIAGNOSIS — J4489 Other specified chronic obstructive pulmonary disease: Secondary | ICD-10-CM | POA: Diagnosis not present

## 2023-04-25 DIAGNOSIS — Z87891 Personal history of nicotine dependence: Secondary | ICD-10-CM | POA: Insufficient documentation

## 2023-04-25 DIAGNOSIS — K219 Gastro-esophageal reflux disease without esophagitis: Secondary | ICD-10-CM | POA: Diagnosis not present

## 2023-04-25 DIAGNOSIS — N1831 Chronic kidney disease, stage 3a: Secondary | ICD-10-CM | POA: Diagnosis not present

## 2023-04-25 DIAGNOSIS — Z86718 Personal history of other venous thrombosis and embolism: Secondary | ICD-10-CM | POA: Diagnosis not present

## 2023-04-25 DIAGNOSIS — M24111 Other articular cartilage disorders, right shoulder: Secondary | ICD-10-CM | POA: Diagnosis not present

## 2023-04-25 DIAGNOSIS — M7541 Impingement syndrome of right shoulder: Secondary | ICD-10-CM | POA: Diagnosis not present

## 2023-04-25 DIAGNOSIS — I129 Hypertensive chronic kidney disease with stage 1 through stage 4 chronic kidney disease, or unspecified chronic kidney disease: Secondary | ICD-10-CM | POA: Insufficient documentation

## 2023-04-25 DIAGNOSIS — M7581 Other shoulder lesions, right shoulder: Secondary | ICD-10-CM | POA: Diagnosis not present

## 2023-04-25 HISTORY — PX: SHOULDER ARTHROSCOPY WITH SUBACROMIAL DECOMPRESSION, ROTATOR CUFF REPAIR AND BICEP TENDON REPAIR: SHX5687

## 2023-04-25 SURGERY — SHOULDER ARTHROSCOPY WITH SUBACROMIAL DECOMPRESSION, ROTATOR CUFF REPAIR AND BICEP TENDON REPAIR
Anesthesia: General | Site: Shoulder | Laterality: Right

## 2023-04-25 MED ORDER — LIDOCAINE HCL (PF) 2 % IJ SOLN
INTRAMUSCULAR | Status: AC
  Filled 2023-04-25: qty 5

## 2023-04-25 MED ORDER — BUPIVACAINE-EPINEPHRINE (PF) 0.5% -1:200000 IJ SOLN
INTRAMUSCULAR | Status: AC
  Filled 2023-04-25: qty 30

## 2023-04-25 MED ORDER — BUPIVACAINE LIPOSOME 1.3 % IJ SUSP
INTRAMUSCULAR | Status: AC
  Filled 2023-04-25: qty 20

## 2023-04-25 MED ORDER — ONDANSETRON HCL 4 MG/2ML IJ SOLN
INTRAMUSCULAR | Status: DC | PRN
  Administered 2023-04-25: 4 mg via INTRAVENOUS

## 2023-04-25 MED ORDER — LEVOFLOXACIN IN D5W 500 MG/100ML IV SOLN
500.0000 mg | INTRAVENOUS | Status: AC
  Administered 2023-04-25: 500 mg via INTRAVENOUS

## 2023-04-25 MED ORDER — BUPIVACAINE HCL (PF) 0.5 % IJ SOLN
INTRAMUSCULAR | Status: DC | PRN
  Administered 2023-04-25: 10 mL

## 2023-04-25 MED ORDER — BUPIVACAINE LIPOSOME 1.3 % IJ SUSP
INTRAMUSCULAR | Status: DC | PRN
  Administered 2023-04-25: 20 mL

## 2023-04-25 MED ORDER — OXYCODONE HCL 5 MG/5ML PO SOLN: 5.0000 mg | Freq: Once | ORAL | Status: DC | PRN

## 2023-04-25 MED ORDER — ORAL CARE MOUTH RINSE: 15.0000 mL | Freq: Once | OROMUCOSAL | Status: AC

## 2023-04-25 MED ORDER — PROPOFOL 10 MG/ML IV BOLUS
INTRAVENOUS | Status: DC | PRN
  Administered 2023-04-25: 120 mg via INTRAVENOUS

## 2023-04-25 MED ORDER — PHENYLEPHRINE 80 MCG/ML (10ML) SYRINGE FOR IV PUSH (FOR BLOOD PRESSURE SUPPORT)
PREFILLED_SYRINGE | INTRAVENOUS | Status: DC | PRN
  Administered 2023-04-25 (×2): 100 ug via INTRAVENOUS
  Administered 2023-04-25: 200 ug via INTRAVENOUS
  Administered 2023-04-25: 100 ug via INTRAVENOUS

## 2023-04-25 MED ORDER — MIDAZOLAM HCL 2 MG/2ML IJ SOLN
1.0000 mg | INTRAMUSCULAR | Status: DC | PRN
Start: 2023-04-25 — End: 2023-04-25
  Administered 2023-04-25: 1 mg via INTRAVENOUS

## 2023-04-25 MED ORDER — FENTANYL CITRATE PF 50 MCG/ML IJ SOSY
PREFILLED_SYRINGE | INTRAMUSCULAR | Status: AC
  Filled 2023-04-25: qty 1

## 2023-04-25 MED ORDER — ROCURONIUM BROMIDE 10 MG/ML (PF) SYRINGE
PREFILLED_SYRINGE | INTRAVENOUS | Status: AC
  Filled 2023-04-25: qty 10

## 2023-04-25 MED ORDER — DEXAMETHASONE SODIUM PHOSPHATE 10 MG/ML IJ SOLN
INTRAMUSCULAR | Status: AC
  Filled 2023-04-25: qty 1

## 2023-04-25 MED ORDER — MIDAZOLAM HCL 2 MG/2ML IJ SOLN
INTRAMUSCULAR | Status: AC
  Filled 2023-04-25: qty 2

## 2023-04-25 MED ORDER — EPHEDRINE SULFATE-NACL 50-0.9 MG/10ML-% IV SOSY
PREFILLED_SYRINGE | INTRAVENOUS | Status: DC | PRN
  Administered 2023-04-25 (×4): 5 mg via INTRAVENOUS

## 2023-04-25 MED ORDER — FENTANYL CITRATE PF 50 MCG/ML IJ SOSY
50.0000 ug | PREFILLED_SYRINGE | Freq: Once | INTRAMUSCULAR | Status: AC
  Administered 2023-04-25: 50 ug via INTRAVENOUS

## 2023-04-25 MED ORDER — BUPIVACAINE HCL (PF) 0.5 % IJ SOLN
INTRAMUSCULAR | Status: AC
  Filled 2023-04-25: qty 10

## 2023-04-25 MED ORDER — LEVOFLOXACIN IN D5W 500 MG/100ML IV SOLN
INTRAVENOUS | Status: AC
  Filled 2023-04-25: qty 100

## 2023-04-25 MED ORDER — VANCOMYCIN HCL IN DEXTROSE 1-5 GM/200ML-% IV SOLN
1000.0000 mg | INTRAVENOUS | Status: AC
  Administered 2023-04-25: 1000 mg via INTRAVENOUS

## 2023-04-25 MED ORDER — PROPOFOL 10 MG/ML IV BOLUS
INTRAVENOUS | Status: AC
  Filled 2023-04-25: qty 20

## 2023-04-25 MED ORDER — FENTANYL CITRATE (PF) 100 MCG/2ML IJ SOLN
INTRAMUSCULAR | Status: DC | PRN
  Administered 2023-04-25: 50 ug via INTRAVENOUS

## 2023-04-25 MED ORDER — ONDANSETRON HCL 4 MG/2ML IJ SOLN
INTRAMUSCULAR | Status: AC
  Filled 2023-04-25: qty 2

## 2023-04-25 MED ORDER — LIDOCAINE HCL (PF) 1 % IJ SOLN
INTRAMUSCULAR | Status: AC
  Filled 2023-04-25: qty 5

## 2023-04-25 MED ORDER — EPINEPHRINE PF 1 MG/ML IJ SOLN
INTRAMUSCULAR | Status: AC
  Filled 2023-04-25: qty 2

## 2023-04-25 MED ORDER — LACTATED RINGERS IV SOLN: INTRAVENOUS | Status: DC

## 2023-04-25 MED ORDER — LACTATED RINGERS IV SOLN
INTRAVENOUS | Status: DC | PRN
  Administered 2023-04-25: 3000 mL

## 2023-04-25 MED ORDER — CEFAZOLIN SODIUM-DEXTROSE 2-4 GM/100ML-% IV SOLN
INTRAVENOUS | Status: AC
  Filled 2023-04-25: qty 100

## 2023-04-25 MED ORDER — OXYCODONE HCL 5 MG PO TABS: 5.0000 mg | ORAL_TABLET | ORAL | 0 refills | Status: DC | PRN

## 2023-04-25 MED ORDER — FENTANYL CITRATE (PF) 100 MCG/2ML IJ SOLN
INTRAMUSCULAR | Status: AC
  Filled 2023-04-25: qty 2

## 2023-04-25 MED ORDER — ROCURONIUM BROMIDE 100 MG/10ML IV SOLN
INTRAVENOUS | Status: DC | PRN
  Administered 2023-04-25: 50 mg via INTRAVENOUS

## 2023-04-25 MED ORDER — FENTANYL CITRATE (PF) 100 MCG/2ML IJ SOLN: 25.0000 ug | INTRAMUSCULAR | Status: DC | PRN

## 2023-04-25 MED ORDER — SUGAMMADEX SODIUM 200 MG/2ML IV SOLN
INTRAVENOUS | Status: DC | PRN
  Administered 2023-04-25: 200 mg via INTRAVENOUS

## 2023-04-25 MED ORDER — BUPIVACAINE-EPINEPHRINE 0.5% -1:200000 IJ SOLN
INTRAMUSCULAR | Status: DC | PRN
  Administered 2023-04-25: 30 mL

## 2023-04-25 MED ORDER — CHLORHEXIDINE GLUCONATE 0.12 % MT SOLN
OROMUCOSAL | Status: AC
  Filled 2023-04-25: qty 15

## 2023-04-25 MED ORDER — PHENYLEPHRINE 80 MCG/ML (10ML) SYRINGE FOR IV PUSH (FOR BLOOD PRESSURE SUPPORT)
PREFILLED_SYRINGE | INTRAVENOUS | Status: AC
  Filled 2023-04-25: qty 10

## 2023-04-25 MED ORDER — CHLORHEXIDINE GLUCONATE 0.12 % MT SOLN
15.0000 mL | Freq: Once | OROMUCOSAL | Status: AC
  Administered 2023-04-25: 15 mL via OROMUCOSAL

## 2023-04-25 MED ORDER — VANCOMYCIN HCL IN DEXTROSE 1-5 GM/200ML-% IV SOLN
INTRAVENOUS | Status: AC
  Filled 2023-04-25: qty 200

## 2023-04-25 MED ORDER — OXYCODONE HCL 5 MG PO TABS: 5.0000 mg | ORAL_TABLET | Freq: Once | ORAL | Status: DC | PRN

## 2023-04-25 MED ORDER — DEXAMETHASONE SODIUM PHOSPHATE 10 MG/ML IJ SOLN
INTRAMUSCULAR | Status: DC | PRN
  Administered 2023-04-25: 8 mg via INTRAVENOUS

## 2023-04-25 MED ORDER — EPHEDRINE 5 MG/ML INJ
INTRAVENOUS | Status: AC
  Filled 2023-04-25: qty 5

## 2023-04-25 SURGICAL SUPPLY — 45 items
ANCHOR HEALICOIL REGEN 5.5 (Anchor) IMPLANT
ANCHOR JUGGERKNOT WTAP NDL 2.9 (Anchor) IMPLANT
ANCHOR QFIX 2.8 SUT MINI TAPE (Anchor) IMPLANT
BIT DRILL JUGRKNT W/NDL BIT2.9 (DRILL) IMPLANT
BLADE FULL RADIUS 3.5 (BLADE) ×1 IMPLANT
BUR ACROMIONIZER 4.0 (BURR) ×1 IMPLANT
CHLORAPREP W/TINT 26 (MISCELLANEOUS) ×1 IMPLANT
COVER MAYO STAND STRL (DRAPES) ×1 IMPLANT
DILATOR 5.5 THREADED HEALICOIL (MISCELLANEOUS) IMPLANT
DRILL JUGGERKNOT W/NDL BIT 2.9 (DRILL) ×1
ELECT CAUTERY BLADE 6.4 (BLADE) ×1 IMPLANT
ELECT REM PT RETURN 9FT ADLT (ELECTROSURGICAL) ×1
ELECTRODE REM PT RTRN 9FT ADLT (ELECTROSURGICAL) ×1 IMPLANT
GAUZE SPONGE 4X4 12PLY STRL (GAUZE/BANDAGES/DRESSINGS) ×1 IMPLANT
GAUZE XEROFORM 1X8 LF (GAUZE/BANDAGES/DRESSINGS) ×1 IMPLANT
GLOVE BIO SURGEON STRL SZ7.5 (GLOVE) ×2 IMPLANT
GLOVE BIO SURGEON STRL SZ8 (GLOVE) ×2 IMPLANT
GLOVE BIOGEL PI IND STRL 8 (GLOVE) ×1 IMPLANT
GLOVE INDICATOR 8.0 STRL GRN (GLOVE) ×1 IMPLANT
GOWN STRL REUS W/ TWL LRG LVL3 (GOWN DISPOSABLE) ×1 IMPLANT
GOWN STRL REUS W/ TWL XL LVL3 (GOWN DISPOSABLE) ×1 IMPLANT
GRASPER SUT 15 45D LOW PRO (SUTURE) IMPLANT
IV LR IRRIG 3000ML ARTHROMATIC (IV SOLUTION) ×2 IMPLANT
KIT CANNULA 8X76-LX IN CANNULA (CANNULA) ×1 IMPLANT
KIT SUTURE 2.8 Q-FIX DISP (MISCELLANEOUS) IMPLANT
MANIFOLD NEPTUNE II (INSTRUMENTS) ×1 IMPLANT
MASK FACE SPIDER DISP (MASK) ×1 IMPLANT
MAT ABSORB FLUID 56X50 GRAY (MISCELLANEOUS) ×1 IMPLANT
PACK ARTHROSCOPY SHOULDER (MISCELLANEOUS) ×1 IMPLANT
PAD ABD DERMACEA PRESS 5X9 (GAUZE/BANDAGES/DRESSINGS) ×2 IMPLANT
PASSER SUT FIRSTPASS SELF (INSTRUMENTS) IMPLANT
SLING ARM LRG DEEP (SOFTGOODS) ×1 IMPLANT
SLING ULTRA II LG (MISCELLANEOUS) ×1 IMPLANT
SPONGE T-LAP 18X18 ~~LOC~~+RFID (SPONGE) ×1 IMPLANT
STAPLER SKIN PROX 35W (STAPLE) ×1 IMPLANT
STRAP SAFETY 5IN WIDE (MISCELLANEOUS) ×1 IMPLANT
SUT ETHIBOND 0 MO6 C/R (SUTURE) ×1 IMPLANT
SUT ULTRABRAID 2 COBRAID 38 (SUTURE) IMPLANT
SUT VIC AB 2-0 CT1 TAPERPNT 27 (SUTURE) ×2 IMPLANT
TAPE MICROFOAM 4IN (TAPE) ×1 IMPLANT
TRAP FLUID SMOKE EVACUATOR (MISCELLANEOUS) ×1 IMPLANT
TUBE SET DOUBLEFLO INFLOW (TUBING) ×1 IMPLANT
TUBING CONNECTING 10 (TUBING) ×1 IMPLANT
WAND WEREWOLF FLOW 90D (MISCELLANEOUS) ×1 IMPLANT
WATER STERILE IRR 500ML POUR (IV SOLUTION) ×1 IMPLANT

## 2023-04-25 NOTE — Anesthesia Preprocedure Evaluation (Signed)
 Anesthesia Evaluation  Patient identified by MRN, date of birth, ID band Patient awake    Reviewed: Allergy & Precautions, NPO status , Patient's Chart, lab work & pertinent test results  Airway Mallampati: III  TM Distance: >3 FB Neck ROM: full    Dental  (+) Chipped, Dental Advidsory Given   Pulmonary neg pulmonary ROS, former smoker   Pulmonary exam normal        Cardiovascular hypertension, On Medications negative cardio ROS Normal cardiovascular exam     Neuro/Psych negative neurological ROS  negative psych ROS   GI/Hepatic Neg liver ROS,GERD  Medicated and Controlled,,  Endo/Other  negative endocrine ROS    Renal/GU Renal disease     Musculoskeletal   Abdominal   Peds  Hematology  (+) Blood dyscrasia, anemia   Anesthesia Other Findings Past Medical History: No date: Allergic rhinitis No date: Anemia No date: Asthma     Comment:  as a child No date: Cataracts, bilateral No date: Colon polyp No date: COPD (chronic obstructive pulmonary disease) (HCC) No date: Endometriosis No date: Essential hypertension No date: Meniere's disease No date: Non-rheumatic mitral regurgitation 03/2023: Nontraumatic incomplete tear of right rotator cuff 2009: Parasitic infection     Comment:  hospitalized x3 No date: Pneumonia 05/13/2012: Pulmonary embolism (HCC)     Comment:  after foot surgery No date: Ruptured lumbar disc No date: Stage 3a chronic kidney disease (CKD) (HCC) No date: Vertigo     Comment:  requires to sit up SLOWLY to avoid vertigo episodes  Past Surgical History: No date: CATARACT EXTRACTION W/ INTRAOCULAR LENS  IMPLANT, BILATERAL No date: COLONOSCOPY     Comment:  2005, 2009, 2016 05/07/2012: FOOT SURGERY; Right     Comment:  flat foot reconstruction No date: TONSILLECTOMY     Comment:  childhood No date: TOTAL ABDOMINAL HYSTERECTOMY W/ BILATERAL SALPINGOOPHORECTOMY  BMI    Body Mass Index:  25.96 kg/m      Reproductive/Obstetrics negative OB ROS                             Anesthesia Physical Anesthesia Plan  ASA: 2  Anesthesia Plan: General   Post-op Pain Management: Regional block*   Induction: Intravenous  PONV Risk Score and Plan: 3 and Ondansetron , Dexamethasone  and Midazolam   Airway Management Planned: Oral ETT  Additional Equipment:   Intra-op Plan:   Post-operative Plan: Extubation in OR  Informed Consent: I have reviewed the patients History and Physical, chart, labs and discussed the procedure including the risks, benefits and alternatives for the proposed anesthesia with the patient or authorized representative who has indicated his/her understanding and acceptance.     Dental Advisory Given  Plan Discussed with: Anesthesiologist, CRNA and Surgeon  Anesthesia Plan Comments: (Patient consented for risks of anesthesia including but not limited to:  - adverse reactions to medications - damage to eyes, teeth, lips or other oral mucosa - nerve damage due to positioning  - sore throat or hoarseness - Damage to heart, brain, nerves, lungs, other parts of body or loss of life  Patient voiced understanding and assent.)       Anesthesia Quick Evaluation

## 2023-04-25 NOTE — Op Note (Signed)
 04/25/2023  10:43 AM  Patient:   Sharon Watson  Pre-Op Diagnosis:   Impingement/tendinopathy with partial-thickness rotator cuff tear and biceps tendinopathy, right shoulder.  Post-Op Diagnosis:   Impingement/tendinopathy with full-thickness rotator cuff tear, labral fraying, and biceps tendinopathy, right shoulder.  Procedure:   Limited arthroscopic debridement, arthroscopic subacromial decompression, mini-open rotator cuff repair, and mini-open biceps tenodesis, right shoulder.  Anesthesia:   General endotracheal with interscalene block using Exparel  placed preoperatively by the anesthesiologist.  Surgeon:   DOROTHA Reyes Maltos, MD  Assistant:   Annabella Gambler, RN  Findings:   As above. There was a substantial articular sided partial-thickness tear involving the entire supraspinatus insertion with a small full-thickness component. The remainder of the rotator cuff was in satisfactory condition, as were the articular surfaces of the glenoid and humerus. There was mild labral fraying anteriorly and superiorly. The biceps tendon demonstrated moderate lip sticking without partial or full-thickness tearing.  Complications:   None  Fluids:   300 cc  Estimated blood loss:   5 cc  Tourniquet time:   None  Drains:   None  Closure:   Staples      Brief clinical note:   The patient is a 78 year old female with a history progressively worsening right shoulder pain and weakness. The patient's symptoms have progressed despite medications, activity modification, etc. The patient's history and examination are consistent with impingement/tendinopathy with a rotator cuff tear. These findings were confirmed by MRI scan. The patient presents at this time for definitive management of these shoulder symptoms.  Procedure:   The patient underwent placement of an interscalene block by the anesthesiologist in the preoperative holding area before being brought into the operating room and lain in the supine  position. The patient then underwent general endotracheal intubation and anesthesia before being repositioned in the beach chair position using the beach chair positioner. The right shoulder and upper extremity were prepped with ChloraPrep solution before being draped sterilely. Preoperative antibiotics were administered. A timeout was performed to confirm the proper surgical site before the expected portal sites and incision site were injected with 0.5% Sensorcaine  with epinephrine .   A posterior portal was created and the glenohumeral joint thoroughly inspected with the findings as described above. An anterior portal was created using an outside-in technique. The labrum and rotator cuff were further probed, again confirming the above-noted findings. The areas of labral fraying were debrided back to stable margins using the full-radius resector, as was the torn portion of the rotator cuff tear. The ArthroCare wand was inserted and used to release the biceps tendon from his labral anchor. It also was used to obtain hemostasis as well as to anneal the labrum superiorly and anteriorly. The instruments were removed from the joint after suctioning the excess fluid.  The camera was repositioned through the posterior portal into the subacromial space. A separate lateral portal was created using an outside-in technique. The 3.5 mm full-radius resector was introduced and used to perform a subtotal bursectomy. The ArthroCare wand was then inserted and used to remove the periosteal tissue off the undersurface of the anterior third of the acromion as well as to recess the coracoacromial ligament from its attachment along the anterior and lateral margins of the acromion. The 4.0 mm acromionizing bur was introduced and used to complete the decompression by removing the undersurface of the anterior third of the acromion. The full radius resector was reintroduced to remove any residual bony debris before the ArthroCare wand  was reintroduced to obtain  hemostasis. The instruments were then removed from the subacromial space after suctioning the excess fluid.  An approximately 4-5 cm incision was made over the anterolateral aspect of the shoulder beginning at the anterolateral corner of the acromion and extending distally in line with the bicipital groove. This incision was carried down through the subcutaneous tissues to expose the deltoid fascia. The raphae between the anterior and middle thirds was identified and this plane developed to provide access into the subacromial space. Additional bursal tissues were debrided sharply using Metzenbaum scissors. The rotator cuff tear was readily identified.   The bicipital groove was identified by palpation and opened for 1-1.5 cm. The biceps tendon stump was retrieved through this defect. The floor of the bicipital groove was roughened with a curet before a Biomet 2.9 mm JuggerKnot anchor was inserted. Both sets of sutures were passed through the biceps tendon and tied securely to effect the tenodesis. The bicipital sheath was reapproximated using two #0 Ethibond interrupted sutures, incorporating the biceps tendon to further reinforce the tenodesis.  The margins of the rotator cuff tear were debrided sharply with a #15 blade and the exposed greater tuberosity roughened with a rongeur. The tear was repaired using two Smith [employer] Nephew 2.8 mm Q-Fix anchors. These sutures were then brought back laterally and secured using two Smith & Nephew Healicoil knotless RegeneSorb anchors to create a two-layer closure. An apparent watertight closure was obtained.  The wound was copiously irrigated with sterile saline solution before the deltoid raphae was reapproximated using 2-0 Vicryl interrupted sutures. The subcutaneous tissues were closed in two layers using 2-0 Vicryl interrupted sutures before the skin was closed using staples. The portal sites also were closed using staples. A sterile bulky  dressing was applied to the shoulder before the arm was placed into a shoulder immobilizer. The patient was then awakened, extubated, and returned to the recovery room in satisfactory condition after tolerating the procedure well.

## 2023-04-25 NOTE — Discharge Instructions (Addendum)
 Orthopedic discharge instructions: Keep dressing dry and intact.  May shower after dressing changed on post-op day #4 (Sunday).  Cover staples with Band-Aids after drying off. Apply ice frequently to shoulder. Take ibuprofen 600-800 mg TID with meals for 3-5 days, then as necessary. Take oxycodone  as prescribed when needed.  May supplement with ES Tylenol  if necessary. Keep shoulder immobilizer on at all times except may remove for bathing purposes. Follow-up in 10-14 days or as scheduled.  SHOULDER SLING IMMOBILIZER   VIDEO Slingshot 2 Shoulder Brace Application - YouTube ---[email or web address]  INSTRUCTIONS While supporting the injured arm, slide the forearm into the sling. Wrap the adjustable shoulder strap around the neck and shoulders and attach the strap end to the sling using  the "alligator strap tab."  Adjust the shoulder strap to the required length. Position the shoulder pad behind the neck. To secure the shoulder pad location (optional), pull the shoulder strap away from the shoulder pad, unfold the hook material on the top of the pad, then press the shoulder strap back onto the hook material to secure the pad in place. Attach the closure strap across the open top of the sling. Position the strap so that it holds the arm securely in the sling. Next, attach the thumb strap to the open end of the sling between the thumb and fingers. After sling has been fit, it may be easily removed and reapplied using the quick release buckle on shoulder strap. If a neutral pillow or 15 abduction pillow is included, place the pillow at the waistline. Attach the sling to the pillow, lining up hook material on the pillow with the loop on sling. Adjust the waist strap to fit.  If waist strap is too long, cut it to fit. Use the small piece of double sided hook material (located on top of the pillow) to secure the strap end. Place the double sided hook material on the inside of  the cut strap end and secure it to the waist strap.     If no pillow is included, attach the waist strap to the sling and adjust to fit.    Washing Instructions: Straps and sling must be removed and cleaned regularly depending on your activity level and perspiration. Hand wash straps and sling in cold water with mild detergent, rinse, air dry

## 2023-04-25 NOTE — Transfer of Care (Signed)
 Immediate Anesthesia Transfer of Care Note  Patient: Sharon Watson  Procedure(s) Performed: RIGHT SHOULDER ARTHROSCOPY WITH DEBRIDEMENT, DECOMPRESSION, ROTATOR CUFF REPAIR, AND PROBABLE BICEPS TENODESIS. - RNFA (Right: Shoulder)  Patient Location: PACU  Anesthesia Type:General  Level of Consciousness: awake, alert , and patient cooperative  Airway & Oxygen Therapy: Patient Spontanous Breathing and Patient connected to face mask oxygen  Post-op Assessment: Report given to RN and Post -op Vital signs reviewed and stable  Post vital signs: Reviewed and stable  Last Vitals:  Vitals Value Taken Time  BP 136/67 04/25/23 1030  Temp 36.2 C 04/25/23 1028  Pulse 78 04/25/23 1032  Resp 15 04/25/23 1032  SpO2 100 % 04/25/23 1032  Vitals shown include unfiled device data.  Last Pain:  Vitals:   04/25/23 0726  TempSrc: Temporal  PainSc: 0-No pain         Complications: No notable events documented.

## 2023-04-25 NOTE — Anesthesia Procedure Notes (Signed)
 Procedure Name: Intubation Date/Time: 04/25/2023 8:37 AM  Performed by: Nada Corean CROME, CRNAPre-anesthesia Checklist: Patient identified, Emergency Drugs available, Suction available, Patient being monitored and Timeout performed Patient Re-evaluated:Patient Re-evaluated prior to induction Oxygen Delivery Method: Circle system utilized Preoxygenation: Pre-oxygenation with 100% oxygen Induction Type: IV induction Ventilation: Mask ventilation without difficulty Laryngoscope Size: McGrath and 3 Grade View: Grade I Tube type: Oral Tube size: 6.5 mm Number of attempts: 1 Airway Equipment and Method: Stylet and Video-laryngoscopy Placement Confirmation: ETT inserted through vocal cords under direct vision, positive ETCO2 and breath sounds checked- equal and bilateral Secured at: 20 cm Tube secured with: Tape Dental Injury: Teeth and Oropharynx as per pre-operative assessment

## 2023-04-25 NOTE — Anesthesia Postprocedure Evaluation (Signed)
 Anesthesia Post Note  Patient: Sharon Watson  Procedure(s) Performed: RIGHT SHOULDER ARTHROSCOPY WITH DEBRIDEMENT, DECOMPRESSION, ROTATOR CUFF REPAIR, AND PROBABLE BICEPS TENODESIS. - RNFA (Right: Shoulder)  Patient location during evaluation: PACU Anesthesia Type: General Level of consciousness: awake and alert Pain management: pain level controlled Vital Signs Assessment: post-procedure vital signs reviewed and stable Respiratory status: spontaneous breathing, nonlabored ventilation, respiratory function stable and patient connected to nasal cannula oxygen Cardiovascular status: blood pressure returned to baseline and stable Postop Assessment: no apparent nausea or vomiting Anesthetic complications: no  No notable events documented.   Last Vitals:  Vitals:   04/25/23 1028 04/25/23 1030  BP: 139/67 136/67  Pulse: 79 81  Resp: 14 12  Temp: (!) 36.2 C   SpO2: 100% 100%    Last Pain:  Vitals:   04/25/23 1030  TempSrc:   PainSc: Asleep                 Debby Mines

## 2023-04-25 NOTE — H&P (Signed)
 History of Present Illness:  Sharon Watson is a 78 y.o. female who presents today as a result of a referral by Dr. Ozell Flake for right shoulder pain.   The patient's symptoms began 6 to 7 months ago and developed without any specific cause or injury . Initially, the patient saw Dr. Flake. He performed a steroid injection which did not provide much relief. Therefore, the patient was started in physical therapy which again provided little if any relief of her symptoms. Therefore, the patient was sent for an MRI scan, then referred to me for further evaluation and treatment. The patient describes the symptoms as moderate (patient is active but has had to make modifications or give up activities) and have the quality of being aching, miserable, nagging, stabbing, tender, and throbbing. The pain is localized to the lateral arm/shoulder. These symptoms are aggravated with normal daily activities, with sleeping, carrying heavy objects, at higher levels of activity, with overhead activity, and reaching behind the back. She has tried acetaminophen  and non-steroidal anti-inflammatories (Advil) with temporary partial relief of her symptoms. She has tried physical therapy and rest without significant benefit. She has tried the one injection noted above with limited benefit.  This complaint is not work related. She is a sports non-participant.  Shoulder Surgical History:  The patient has had no shoulder surgery in the past.  PMH/PSH/Family History/Social History/Meds/Allergies:  I have reviewed past medical, surgical, social and family history, medications and allergies as documented in the EMR.  Current Outpatient Medications:  ascorbate calcium/bioflavonoid (ESTER-C ORAL) Take by mouth once daily  azelastine (ASTELIN) 137 mcg nasal spray Place 1 spray into both nostrils 2 (two) times daily 10 mL 1  calcium carbonate-vitamin D3 (CALTRATE 600+D) 600 mg(1,500mg ) -400 unit tablet Take 1 tablet by mouth 2 (two)  times daily with meals  clobetasoL  (CORMAX ) 0.05 % external solution Apply topically  cyanocobalamin  (VITAMIN B12) 1000 MCG tablet Take 1,000 mcg by mouth once daily  cyclobenzaprine (FLEXERIL) 5 MG tablet Take 5 mg by mouth at bedtime  estradioL (ESTRACE) 0.5 MG tablet take 1 tablet daily 90 tablet 3  famotidine (PEPCID) 20 MG tablet Take 1 tablet (20 mg total) by mouth 2 (two) times daily 60 tablet 11  FOLIC ACID /MULTIVIT-MIN/LUTEIN (CENTRUM SILVER ORAL) Take by mouth.  Herbal Supplement Herbal Name:  For vertigo  hydroCHLOROthiazide (HYDRODIURIL) 12.5 MG tablet Take 1 tablet (12.5 mg total) by mouth once daily 90 tablet 0  lisinopriL-hydroCHLOROthiazide (ZESTORETIC) 10-12.5 mg tablet take 1 tablet once daily 90 tablet 0  mupirocin (BACTROBAN) 2 % ointment APPLY TO THE AFFECTED AREA ONCE DAILY AS NEEDED 22 g 0   Allergies:  Cefdinir Abdominal Pain  Other reaction(s): Abdominal Pain   Past Medical History:  Allergic rhinitis  Anemia  Asthma without status asthmaticus (HHS-HCC)  Cataract  Endometriosis  Fibrocystic breast  Hemorrhoids  History of chickenpox  History of parasitic infection  Hospitalized x3 in January 2009, states she had a parasitic infection.  Hyperplastic colon polyp 06/29/2014  Hypertension  Meniere's disease  Posterior tibial tendon dysfunction  S/P surgical correction  Redundant colon 06/29/2014  Ruptured lumbar disc  Sciatica  Venous thromboembolism about 5 yrs ago  left shoulder, post surgery   Past Surgical History:  COLONOSCOPY 01/18/2004  COLONOSCOPY 05/06/2007 (Repeat 5 Years - Dr. Sonia, [facility])  Foot surgery 05/07/2012 (Flatfoot reconstruction, posterior tibial dysfunction repair, gastroc recession ) COLONOSCOPY 06/29/2014 (Hyperplastic colon polyp/Redundant colon/Repeat 23yrs/MUS)  OOPHORECTOMY (At time of hysterectomy)  TONSILLECTOMY as a child  TAH and BSO early 1980s (for cyst and endometriosis)   Family History:  Dementia  Mother Charolette (in her 89s)  Alcohol abuse Mother Charolette  Osteoporosis (Thinning of bones) Mother Charolette  Hip fracture Mother Charolette  Alcohol abuse Father  Diabetes Maternal Uncle Brew Tomes  Deceased  Myocardial Infarction (Heart attack) Maternal Uncle Brew Tomes  Colon cancer Maternal Grandfather Guillermina Tomes  Cancer Maternal Grandfather Guillermina Tomes  Allergies Other Scientific Laboratory Technician)   Social History:   Socioeconomic History:  Marital status: Married  Tobacco Use  Smoking status: Former  Types: Cigarettes  Smokeless tobacco: Never  Tobacco comments:  Don'tknow exact dates, but about 30 years ago  Vaping Use  Vaping status: Never Used  Substance and Sexual Activity  Alcohol use: Not Currently  Drug use: Never  Sexual activity: Yes  Partners: Male  Birth control/protection: Surgical  Comment: Had hysterectomy  Other Topics Concern  Would you please tell us  about the people who live in your home, your pets, or anything else important to your social life? Yes   Review of Systems:  A comprehensive 14 point ROS was performed, reviewed, and the pertinent orthopaedic findings are documented in the HPI.  Physical Exam:  Vitals:  04/09/23 1123  BP: (!) 158/80  Weight: 72.3 kg (159 lb 6.4 oz)  Height: 160 cm (5' 3)  PainSc: 2  PainLoc: Shoulder   General/Constitutional: The patient appears to be well-nourished, well-developed, and in no acute distress. Neuro/Psych: Normal mood and affect, oriented to person, place and time. Eyes: Non-icteric. Pupils are equal, round, and reactive to light, and exhibit synchronous movement. ENT: Unremarkable. Lymphatic: No palpable adenopathy. Respiratory: Lungs clear to auscultation, Normal chest excursion, No wheezes, and Non-labored breathing Cardiovascular: Regular rate and rhythm. No murmurs. and No edema, swelling or tenderness, except as noted in detailed exam. Integumentary: No impressive skin lesions present, except as  noted in detailed exam. Musculoskeletal: Unremarkable, except as noted in detailed exam.  Right shoulder exam: SKIN: normal SWELLING: none WARMTH: none LYMPH NODES: no adenopathy palpable CREPITUS: none TENDERNESS: Non-tender ROM (active):  Forward flexion: 160 degrees Abduction: 155 degrees Internal rotation: L1 ROM (passive):  Forward flexion: 165 degrees Abduction: 160 degrees  ER/IR at 90 abd: 90 degrees / 55 degrees  She experiences mild-moderate pain with forward flexion, abduction, and internal rotation.  STRENGTH: Forward flexion: 4/5 Abduction: 4-4+/5 External rotation: 4-4+/5 Internal rotation: 4+/5 Pain with RC testing: Moderate pain with resisted forward flexion, mild pain with resisted abduction and external rotation  STABILITY: Normal  SPECIAL TESTS: Vonzell' test: positive, moderate Speed's test: Mildly positive Capsulitis - pain w/ passive ER: no Crossed arm test: Mildly positive Crank: Not evaluated Anterior apprehension: Negative Posterior apprehension: Not evaluated  She is neurovascularly intact to the right upper extremity.  Imaging:  Right Shoulder MRI: MRI Shoulder Cartilage: No cartilage abnormality. MRI Shoulder Rotator Cuff: Partial thickness tear of the supraspinatus. No retraction. MRI Shoulder Labrum / Biceps: No labral tear or biceps abormality. MRI Shoulder Bone: Normal bone.  Both the films and report were reviewed by myself and discussed with the patient.  Assessment:  Tendinitis of upper biceps tendon of right shoulder.  Rotator cuff tendinitis, right.  Nontraumatic incomplete tear of right rotator cuff.   Plan:  The treatment options were discussed with the patient. In addition, patient educational materials were provided regarding the diagnosis and treatment options. The patient is quite frustrated by her symptoms and functional limitations, and is ready to consider more aggressive treatment options.  Therefore, I have  recommended a surgical procedure, specifically a right shoulder arthroscopy with debridement, decompression, rotator cuff repair, and probable biceps tenodesis. The procedure was discussed with the patient, as were the potential risks (including bleeding, infection, nerve and/or blood vessel injury, persistent or recurrent pain, failure of the repair, progression of arthritis, need for further surgery, blood clots, strokes, heart attacks and/or arhythmias, pneumonia, etc.) and benefits. The patient states her understanding and wishes to proceed. All of the patient's questions and concerns were answered. She can call any time with further concerns. She will follow up post-surgery, routine.    H&P reviewed and patient re-examined. No changes.

## 2023-04-25 NOTE — Anesthesia Procedure Notes (Signed)
 Anesthesia Regional Block: Interscalene brachial plexus block   Pre-Anesthetic Checklist: , timeout performed,  Correct Patient, Correct Site, Correct Laterality,  Correct Procedure, Correct Position, site marked,  Risks and benefits discussed,  Surgical consent,  Pre-op evaluation,  At surgeon's request and post-op pain management  Laterality: Right  Prep: chloraprep       Needles:  Injection technique: Single-shot  Needle Type: Echogenic Needle     Needle Length: 4cm  Needle Gauge: 25     Additional Needles:   Procedures:,,,, ultrasound used (permanent image in chart),,    Narrative:  Start time: 04/25/2023 8:25 AM End time: 04/25/2023 8:27 AM Injection made incrementally with aspirations every 5 mL.  Performed by: Personally  Anesthesiologist: Leavy Ned, MD  Additional Notes: Patient's chart reviewed and they were deemed appropriate candidate for procedure, at surgeon's request. Patient educated about risks, benefits, and alternatives of the block including but not limited to: temporary or permanent nerve damage, bleeding, infection, damage to surround tissues, pneumothorax, hemidiaphragmatic paralysis, unilateral Horner's syndrome, block failure, local anesthetic toxicity. Patient expressed understanding. A formal time-out was conducted consistent with institution rules.  Monitors were applied, and minimal sedation used (see nursing record). The site was prepped with skin prep and allowed to dry, and sterile gloves were used. A high frequency linear ultrasound probe with probe cover was utilized throughout. C5-7 nerve roots located and appeared anatomically normal, local anesthetic injected around them, and echogenic block needle trajectory was monitored throughout. Aspiration performed every 5ml. Lung and blood vessels were avoided. All injections were performed without resistance and free of blood and paresthesias. The patient tolerated the procedure well.  Injectate: 20ml  exparel  + 10ml 0.5% bupivacaine 

## 2023-04-26 ENCOUNTER — Encounter: Payer: Self-pay | Admitting: Surgery

## 2023-04-30 DIAGNOSIS — M25611 Stiffness of right shoulder, not elsewhere classified: Secondary | ICD-10-CM | POA: Diagnosis not present

## 2023-04-30 DIAGNOSIS — G8929 Other chronic pain: Secondary | ICD-10-CM | POA: Diagnosis not present

## 2023-04-30 DIAGNOSIS — Z9889 Other specified postprocedural states: Secondary | ICD-10-CM | POA: Diagnosis not present

## 2023-04-30 DIAGNOSIS — M25511 Pain in right shoulder: Secondary | ICD-10-CM | POA: Diagnosis not present

## 2023-05-08 DIAGNOSIS — M25511 Pain in right shoulder: Secondary | ICD-10-CM | POA: Diagnosis not present

## 2023-05-08 DIAGNOSIS — G8929 Other chronic pain: Secondary | ICD-10-CM | POA: Diagnosis not present

## 2023-05-14 DIAGNOSIS — M25511 Pain in right shoulder: Secondary | ICD-10-CM | POA: Diagnosis not present

## 2023-05-14 DIAGNOSIS — M25611 Stiffness of right shoulder, not elsewhere classified: Secondary | ICD-10-CM | POA: Diagnosis not present

## 2023-05-14 DIAGNOSIS — M7581 Other shoulder lesions, right shoulder: Secondary | ICD-10-CM | POA: Diagnosis not present

## 2023-05-14 DIAGNOSIS — G8929 Other chronic pain: Secondary | ICD-10-CM | POA: Diagnosis not present

## 2023-05-14 DIAGNOSIS — Z9889 Other specified postprocedural states: Secondary | ICD-10-CM | POA: Diagnosis not present

## 2023-05-29 DIAGNOSIS — Z03818 Encounter for observation for suspected exposure to other biological agents ruled out: Secondary | ICD-10-CM | POA: Diagnosis not present

## 2023-05-29 DIAGNOSIS — Z8709 Personal history of other diseases of the respiratory system: Secondary | ICD-10-CM | POA: Diagnosis not present

## 2023-05-29 DIAGNOSIS — J019 Acute sinusitis, unspecified: Secondary | ICD-10-CM | POA: Diagnosis not present

## 2023-05-29 DIAGNOSIS — B9689 Other specified bacterial agents as the cause of diseases classified elsewhere: Secondary | ICD-10-CM | POA: Diagnosis not present

## 2023-05-29 DIAGNOSIS — J329 Chronic sinusitis, unspecified: Secondary | ICD-10-CM | POA: Diagnosis not present

## 2023-05-30 DIAGNOSIS — M25511 Pain in right shoulder: Secondary | ICD-10-CM | POA: Diagnosis not present

## 2023-05-30 DIAGNOSIS — Z9889 Other specified postprocedural states: Secondary | ICD-10-CM | POA: Diagnosis not present

## 2023-05-30 DIAGNOSIS — G8929 Other chronic pain: Secondary | ICD-10-CM | POA: Diagnosis not present

## 2023-05-30 DIAGNOSIS — M7581 Other shoulder lesions, right shoulder: Secondary | ICD-10-CM | POA: Diagnosis not present

## 2023-05-30 DIAGNOSIS — M25611 Stiffness of right shoulder, not elsewhere classified: Secondary | ICD-10-CM | POA: Diagnosis not present

## 2023-06-06 DIAGNOSIS — M25511 Pain in right shoulder: Secondary | ICD-10-CM | POA: Diagnosis not present

## 2023-06-06 DIAGNOSIS — G8929 Other chronic pain: Secondary | ICD-10-CM | POA: Diagnosis not present

## 2023-06-11 DIAGNOSIS — M25511 Pain in right shoulder: Secondary | ICD-10-CM | POA: Diagnosis not present

## 2023-06-11 DIAGNOSIS — G8929 Other chronic pain: Secondary | ICD-10-CM | POA: Diagnosis not present

## 2023-06-13 DIAGNOSIS — M7581 Other shoulder lesions, right shoulder: Secondary | ICD-10-CM | POA: Diagnosis not present

## 2023-06-13 DIAGNOSIS — M25511 Pain in right shoulder: Secondary | ICD-10-CM | POA: Diagnosis not present

## 2023-06-13 DIAGNOSIS — M25611 Stiffness of right shoulder, not elsewhere classified: Secondary | ICD-10-CM | POA: Diagnosis not present

## 2023-06-13 DIAGNOSIS — G8929 Other chronic pain: Secondary | ICD-10-CM | POA: Diagnosis not present

## 2023-06-13 DIAGNOSIS — Z9889 Other specified postprocedural states: Secondary | ICD-10-CM | POA: Diagnosis not present

## 2023-06-21 DIAGNOSIS — G8929 Other chronic pain: Secondary | ICD-10-CM | POA: Diagnosis not present

## 2023-06-21 DIAGNOSIS — M25511 Pain in right shoulder: Secondary | ICD-10-CM | POA: Diagnosis not present

## 2023-06-27 DIAGNOSIS — G8929 Other chronic pain: Secondary | ICD-10-CM | POA: Diagnosis not present

## 2023-06-27 DIAGNOSIS — M25511 Pain in right shoulder: Secondary | ICD-10-CM | POA: Diagnosis not present

## 2023-07-03 DIAGNOSIS — Z87898 Personal history of other specified conditions: Secondary | ICD-10-CM | POA: Diagnosis not present

## 2023-07-03 DIAGNOSIS — M25511 Pain in right shoulder: Secondary | ICD-10-CM | POA: Diagnosis not present

## 2023-07-03 DIAGNOSIS — K219 Gastro-esophageal reflux disease without esophagitis: Secondary | ICD-10-CM | POA: Diagnosis not present

## 2023-07-03 DIAGNOSIS — I10 Essential (primary) hypertension: Secondary | ICD-10-CM | POA: Diagnosis not present

## 2023-07-04 DIAGNOSIS — G8929 Other chronic pain: Secondary | ICD-10-CM | POA: Diagnosis not present

## 2023-07-04 DIAGNOSIS — M25511 Pain in right shoulder: Secondary | ICD-10-CM | POA: Diagnosis not present

## 2023-07-04 DIAGNOSIS — M25611 Stiffness of right shoulder, not elsewhere classified: Secondary | ICD-10-CM | POA: Diagnosis not present

## 2023-07-04 DIAGNOSIS — Z9889 Other specified postprocedural states: Secondary | ICD-10-CM | POA: Diagnosis not present

## 2023-07-04 DIAGNOSIS — M7581 Other shoulder lesions, right shoulder: Secondary | ICD-10-CM | POA: Diagnosis not present

## 2023-07-11 DIAGNOSIS — M25511 Pain in right shoulder: Secondary | ICD-10-CM | POA: Diagnosis not present

## 2023-07-11 DIAGNOSIS — G8929 Other chronic pain: Secondary | ICD-10-CM | POA: Diagnosis not present

## 2023-07-13 DIAGNOSIS — M25511 Pain in right shoulder: Secondary | ICD-10-CM | POA: Diagnosis not present

## 2023-07-13 DIAGNOSIS — G8929 Other chronic pain: Secondary | ICD-10-CM | POA: Diagnosis not present

## 2023-07-17 DIAGNOSIS — G8929 Other chronic pain: Secondary | ICD-10-CM | POA: Diagnosis not present

## 2023-07-17 DIAGNOSIS — M25511 Pain in right shoulder: Secondary | ICD-10-CM | POA: Diagnosis not present

## 2023-07-24 DIAGNOSIS — G8929 Other chronic pain: Secondary | ICD-10-CM | POA: Diagnosis not present

## 2023-07-24 DIAGNOSIS — M25511 Pain in right shoulder: Secondary | ICD-10-CM | POA: Diagnosis not present

## 2023-07-27 DIAGNOSIS — M25511 Pain in right shoulder: Secondary | ICD-10-CM | POA: Diagnosis not present

## 2023-07-27 DIAGNOSIS — Z9889 Other specified postprocedural states: Secondary | ICD-10-CM | POA: Diagnosis not present

## 2023-07-27 DIAGNOSIS — M25611 Stiffness of right shoulder, not elsewhere classified: Secondary | ICD-10-CM | POA: Diagnosis not present

## 2023-07-27 DIAGNOSIS — G8929 Other chronic pain: Secondary | ICD-10-CM | POA: Diagnosis not present

## 2023-07-27 DIAGNOSIS — M7581 Other shoulder lesions, right shoulder: Secondary | ICD-10-CM | POA: Diagnosis not present

## 2023-07-30 DIAGNOSIS — G8929 Other chronic pain: Secondary | ICD-10-CM | POA: Diagnosis not present

## 2023-07-30 DIAGNOSIS — M25511 Pain in right shoulder: Secondary | ICD-10-CM | POA: Diagnosis not present

## 2023-08-06 DIAGNOSIS — G8929 Other chronic pain: Secondary | ICD-10-CM | POA: Diagnosis not present

## 2023-08-06 DIAGNOSIS — M25511 Pain in right shoulder: Secondary | ICD-10-CM | POA: Diagnosis not present

## 2023-08-06 DIAGNOSIS — M7581 Other shoulder lesions, right shoulder: Secondary | ICD-10-CM | POA: Diagnosis not present

## 2023-08-06 DIAGNOSIS — Z9889 Other specified postprocedural states: Secondary | ICD-10-CM | POA: Diagnosis not present

## 2023-08-06 DIAGNOSIS — M25611 Stiffness of right shoulder, not elsewhere classified: Secondary | ICD-10-CM | POA: Diagnosis not present

## 2023-08-09 DIAGNOSIS — M25511 Pain in right shoulder: Secondary | ICD-10-CM | POA: Diagnosis not present

## 2023-08-09 DIAGNOSIS — M25611 Stiffness of right shoulder, not elsewhere classified: Secondary | ICD-10-CM | POA: Diagnosis not present

## 2023-08-09 DIAGNOSIS — G8929 Other chronic pain: Secondary | ICD-10-CM | POA: Diagnosis not present

## 2023-08-09 DIAGNOSIS — Z9889 Other specified postprocedural states: Secondary | ICD-10-CM | POA: Diagnosis not present

## 2023-08-09 DIAGNOSIS — M7581 Other shoulder lesions, right shoulder: Secondary | ICD-10-CM | POA: Diagnosis not present

## 2023-08-14 DIAGNOSIS — G8929 Other chronic pain: Secondary | ICD-10-CM | POA: Diagnosis not present

## 2023-08-14 DIAGNOSIS — M25511 Pain in right shoulder: Secondary | ICD-10-CM | POA: Diagnosis not present

## 2023-08-17 DIAGNOSIS — M25511 Pain in right shoulder: Secondary | ICD-10-CM | POA: Diagnosis not present

## 2023-08-17 DIAGNOSIS — G8929 Other chronic pain: Secondary | ICD-10-CM | POA: Diagnosis not present

## 2023-08-17 DIAGNOSIS — M25611 Stiffness of right shoulder, not elsewhere classified: Secondary | ICD-10-CM | POA: Diagnosis not present

## 2023-08-17 DIAGNOSIS — Z9889 Other specified postprocedural states: Secondary | ICD-10-CM | POA: Diagnosis not present

## 2023-08-22 DIAGNOSIS — M48062 Spinal stenosis, lumbar region with neurogenic claudication: Secondary | ICD-10-CM | POA: Diagnosis not present

## 2023-08-22 DIAGNOSIS — M5416 Radiculopathy, lumbar region: Secondary | ICD-10-CM | POA: Diagnosis not present

## 2023-08-24 DIAGNOSIS — Z9889 Other specified postprocedural states: Secondary | ICD-10-CM | POA: Diagnosis not present

## 2023-08-24 DIAGNOSIS — G8929 Other chronic pain: Secondary | ICD-10-CM | POA: Diagnosis not present

## 2023-08-24 DIAGNOSIS — M25511 Pain in right shoulder: Secondary | ICD-10-CM | POA: Diagnosis not present

## 2023-08-24 DIAGNOSIS — M25611 Stiffness of right shoulder, not elsewhere classified: Secondary | ICD-10-CM | POA: Diagnosis not present

## 2023-08-30 DIAGNOSIS — M62830 Muscle spasm of back: Secondary | ICD-10-CM | POA: Diagnosis not present

## 2023-09-03 DIAGNOSIS — M75111 Incomplete rotator cuff tear or rupture of right shoulder, not specified as traumatic: Secondary | ICD-10-CM | POA: Diagnosis not present

## 2023-09-03 DIAGNOSIS — M24111 Other articular cartilage disorders, right shoulder: Secondary | ICD-10-CM | POA: Diagnosis not present

## 2023-09-03 DIAGNOSIS — M7521 Bicipital tendinitis, right shoulder: Secondary | ICD-10-CM | POA: Diagnosis not present

## 2023-09-03 DIAGNOSIS — M7581 Other shoulder lesions, right shoulder: Secondary | ICD-10-CM | POA: Diagnosis not present

## 2023-09-10 DIAGNOSIS — J019 Acute sinusitis, unspecified: Secondary | ICD-10-CM | POA: Diagnosis not present

## 2023-09-10 DIAGNOSIS — J029 Acute pharyngitis, unspecified: Secondary | ICD-10-CM | POA: Diagnosis not present

## 2023-09-10 DIAGNOSIS — Z03818 Encounter for observation for suspected exposure to other biological agents ruled out: Secondary | ICD-10-CM | POA: Diagnosis not present

## 2023-09-10 DIAGNOSIS — B9689 Other specified bacterial agents as the cause of diseases classified elsewhere: Secondary | ICD-10-CM | POA: Diagnosis not present

## 2023-09-12 DIAGNOSIS — Z1231 Encounter for screening mammogram for malignant neoplasm of breast: Secondary | ICD-10-CM | POA: Diagnosis not present

## 2023-09-12 DIAGNOSIS — Z124 Encounter for screening for malignant neoplasm of cervix: Secondary | ICD-10-CM | POA: Diagnosis not present

## 2023-09-12 DIAGNOSIS — Z1211 Encounter for screening for malignant neoplasm of colon: Secondary | ICD-10-CM | POA: Diagnosis not present

## 2023-09-17 ENCOUNTER — Other Ambulatory Visit: Payer: Self-pay | Admitting: Obstetrics and Gynecology

## 2023-09-17 DIAGNOSIS — Z1231 Encounter for screening mammogram for malignant neoplasm of breast: Secondary | ICD-10-CM

## 2023-09-25 DIAGNOSIS — M48062 Spinal stenosis, lumbar region with neurogenic claudication: Secondary | ICD-10-CM | POA: Diagnosis not present

## 2023-09-25 DIAGNOSIS — M5416 Radiculopathy, lumbar region: Secondary | ICD-10-CM | POA: Diagnosis not present

## 2023-10-02 DIAGNOSIS — H40003 Preglaucoma, unspecified, bilateral: Secondary | ICD-10-CM | POA: Diagnosis not present

## 2023-10-09 DIAGNOSIS — Z961 Presence of intraocular lens: Secondary | ICD-10-CM | POA: Diagnosis not present

## 2023-10-09 DIAGNOSIS — H353131 Nonexudative age-related macular degeneration, bilateral, early dry stage: Secondary | ICD-10-CM | POA: Diagnosis not present

## 2023-10-09 DIAGNOSIS — H43813 Vitreous degeneration, bilateral: Secondary | ICD-10-CM | POA: Diagnosis not present

## 2023-10-17 DIAGNOSIS — M48062 Spinal stenosis, lumbar region with neurogenic claudication: Secondary | ICD-10-CM | POA: Diagnosis not present

## 2023-10-17 DIAGNOSIS — M5416 Radiculopathy, lumbar region: Secondary | ICD-10-CM | POA: Diagnosis not present

## 2023-10-17 DIAGNOSIS — M1612 Unilateral primary osteoarthritis, left hip: Secondary | ICD-10-CM | POA: Diagnosis not present

## 2023-10-23 ENCOUNTER — Ambulatory Visit
Admission: RE | Admit: 2023-10-23 | Discharge: 2023-10-23 | Disposition: A | Discharge status: Home / Self Care | Source: Ambulatory Visit | Attending: Obstetrics and Gynecology | Admitting: Obstetrics and Gynecology

## 2023-10-23 DIAGNOSIS — Z1231 Encounter for screening mammogram for malignant neoplasm of breast: Secondary | ICD-10-CM | POA: Diagnosis not present

## 2023-11-01 DIAGNOSIS — M1612 Unilateral primary osteoarthritis, left hip: Secondary | ICD-10-CM | POA: Diagnosis not present

## 2023-11-14 DIAGNOSIS — Z79899 Other long term (current) drug therapy: Secondary | ICD-10-CM | POA: Diagnosis not present

## 2023-11-14 DIAGNOSIS — D649 Anemia, unspecified: Secondary | ICD-10-CM | POA: Diagnosis not present

## 2023-11-14 DIAGNOSIS — Z8669 Personal history of other diseases of the nervous system and sense organs: Secondary | ICD-10-CM | POA: Diagnosis not present

## 2023-11-14 DIAGNOSIS — R5383 Other fatigue: Secondary | ICD-10-CM | POA: Diagnosis not present

## 2023-11-14 DIAGNOSIS — I10 Essential (primary) hypertension: Secondary | ICD-10-CM | POA: Diagnosis not present

## 2023-11-14 DIAGNOSIS — R400 Somnolence: Secondary | ICD-10-CM | POA: Diagnosis not present

## 2023-11-14 DIAGNOSIS — R5381 Other malaise: Secondary | ICD-10-CM | POA: Diagnosis not present

## 2023-11-22 DIAGNOSIS — R300 Dysuria: Secondary | ICD-10-CM | POA: Diagnosis not present

## 2023-11-22 DIAGNOSIS — M1612 Unilateral primary osteoarthritis, left hip: Secondary | ICD-10-CM | POA: Diagnosis not present

## 2023-11-22 DIAGNOSIS — M5416 Radiculopathy, lumbar region: Secondary | ICD-10-CM | POA: Diagnosis not present

## 2023-11-22 DIAGNOSIS — M48062 Spinal stenosis, lumbar region with neurogenic claudication: Secondary | ICD-10-CM | POA: Diagnosis not present

## 2023-11-27 DIAGNOSIS — Z03818 Encounter for observation for suspected exposure to other biological agents ruled out: Secondary | ICD-10-CM | POA: Diagnosis not present

## 2023-11-27 DIAGNOSIS — J029 Acute pharyngitis, unspecified: Secondary | ICD-10-CM | POA: Diagnosis not present

## 2023-11-30 DIAGNOSIS — H8101 Meniere's disease, right ear: Secondary | ICD-10-CM | POA: Diagnosis not present

## 2023-11-30 DIAGNOSIS — J301 Allergic rhinitis due to pollen: Secondary | ICD-10-CM | POA: Diagnosis not present

## 2023-12-14 ENCOUNTER — Other Ambulatory Visit: Payer: Self-pay | Admitting: Family Medicine

## 2023-12-14 DIAGNOSIS — M25552 Pain in left hip: Secondary | ICD-10-CM

## 2023-12-14 DIAGNOSIS — M5416 Radiculopathy, lumbar region: Secondary | ICD-10-CM

## 2023-12-14 DIAGNOSIS — M4807 Spinal stenosis, lumbosacral region: Secondary | ICD-10-CM

## 2023-12-24 DIAGNOSIS — J029 Acute pharyngitis, unspecified: Secondary | ICD-10-CM | POA: Diagnosis not present

## 2023-12-24 DIAGNOSIS — J069 Acute upper respiratory infection, unspecified: Secondary | ICD-10-CM | POA: Diagnosis not present

## 2023-12-24 DIAGNOSIS — Z03818 Encounter for observation for suspected exposure to other biological agents ruled out: Secondary | ICD-10-CM | POA: Diagnosis not present

## 2023-12-25 ENCOUNTER — Ambulatory Visit
Admission: RE | Admit: 2023-12-25 | Discharge: 2023-12-25 | Disposition: A | Discharge status: Home / Self Care | Source: Ambulatory Visit | Attending: Family Medicine | Admitting: Family Medicine

## 2023-12-25 DIAGNOSIS — M25552 Pain in left hip: Secondary | ICD-10-CM

## 2023-12-25 DIAGNOSIS — M1612 Unilateral primary osteoarthritis, left hip: Secondary | ICD-10-CM | POA: Diagnosis not present

## 2023-12-25 DIAGNOSIS — M48061 Spinal stenosis, lumbar region without neurogenic claudication: Secondary | ICD-10-CM | POA: Diagnosis not present

## 2023-12-25 DIAGNOSIS — M4807 Spinal stenosis, lumbosacral region: Secondary | ICD-10-CM

## 2023-12-25 DIAGNOSIS — M5127 Other intervertebral disc displacement, lumbosacral region: Secondary | ICD-10-CM | POA: Diagnosis not present

## 2023-12-25 DIAGNOSIS — M7602 Gluteal tendinitis, left hip: Secondary | ICD-10-CM | POA: Diagnosis not present

## 2023-12-25 DIAGNOSIS — M5416 Radiculopathy, lumbar region: Secondary | ICD-10-CM

## 2023-12-26 DIAGNOSIS — I10 Essential (primary) hypertension: Secondary | ICD-10-CM | POA: Diagnosis not present

## 2024-01-02 DIAGNOSIS — D649 Anemia, unspecified: Secondary | ICD-10-CM | POA: Diagnosis not present

## 2024-01-02 DIAGNOSIS — R002 Palpitations: Secondary | ICD-10-CM | POA: Diagnosis not present

## 2024-01-02 DIAGNOSIS — M5416 Radiculopathy, lumbar region: Secondary | ICD-10-CM | POA: Diagnosis not present

## 2024-01-02 DIAGNOSIS — E785 Hyperlipidemia, unspecified: Secondary | ICD-10-CM | POA: Diagnosis not present

## 2024-01-02 DIAGNOSIS — I10 Essential (primary) hypertension: Secondary | ICD-10-CM | POA: Diagnosis not present

## 2024-01-02 DIAGNOSIS — Z1331 Encounter for screening for depression: Secondary | ICD-10-CM | POA: Diagnosis not present

## 2024-01-02 DIAGNOSIS — Z8669 Personal history of other diseases of the nervous system and sense organs: Secondary | ICD-10-CM | POA: Diagnosis not present

## 2024-01-02 DIAGNOSIS — Z0000 Encounter for general adult medical examination without abnormal findings: Secondary | ICD-10-CM | POA: Diagnosis not present

## 2024-01-02 DIAGNOSIS — R42 Dizziness and giddiness: Secondary | ICD-10-CM | POA: Diagnosis not present

## 2024-01-10 DIAGNOSIS — M5416 Radiculopathy, lumbar region: Secondary | ICD-10-CM | POA: Diagnosis not present

## 2024-01-10 DIAGNOSIS — M48062 Spinal stenosis, lumbar region with neurogenic claudication: Secondary | ICD-10-CM | POA: Diagnosis not present

## 2024-02-01 DIAGNOSIS — B9689 Other specified bacterial agents as the cause of diseases classified elsewhere: Secondary | ICD-10-CM | POA: Diagnosis not present

## 2024-02-01 DIAGNOSIS — J019 Acute sinusitis, unspecified: Secondary | ICD-10-CM | POA: Diagnosis not present

## 2024-02-15 DIAGNOSIS — J209 Acute bronchitis, unspecified: Secondary | ICD-10-CM | POA: Diagnosis not present

## 2024-02-15 DIAGNOSIS — J069 Acute upper respiratory infection, unspecified: Secondary | ICD-10-CM | POA: Diagnosis not present

## 2024-02-21 DIAGNOSIS — J40 Bronchitis, not specified as acute or chronic: Secondary | ICD-10-CM | POA: Diagnosis not present

## 2024-02-21 NOTE — Progress Notes (Signed)
 Chief Complaint:   Chief Complaint  Patient presents with  . Sore Throat    X 1 week Dx w/ bronchitis in [place]   Rx Augmentin and methylprednisolone   Has finished methylprednisolone  but still taking Augmentin  Having nasal drainage/congestion, productive cough w/ yellow mucous, SOB, bil rib discomfort w/ cough  Denies pain w/ swallowing, chest pain, HA, n/v/d Taking Mucinex , last dose this AM 1030  . Hoarse    Subjective:   Sharon Watson is a 78 y.o. female established patient in today for:  HPI Patient presents to clinic with cough and shortness of breath. Symptom onset 1 week ago. She was seen in [place]  for the cough, which has been productive of yellow sputum, congestion, yellow rhinorrhea, chills, and body aches. She was treated with Augmentin and a methylprednisolone  taper, which she has been taking as prescribed. Completed the prednisolone, still taking Augmentin. However, her symptoms have continued to worsen. She began losing her voice 3 days ago. Two days ago she started experiencing shortness of breath at night only. This has improved with use of her husband's Combivent inhaler. No exertional chest pain though she has had bilateral lower-lateral rib pain when coughing only. Also reports intermittent dizziness which self resolves after several seconds. She notes a history of vertigo and frequent similar episodes of dizziness. No fevers, ear pain, sinus pain, sore throat, N/V/D, abdominal pain, urinary symptoms, falls, or loss of consciousness. She has been taking Mucinex  and Promethazine-DM with improvement, though she notes it has not given her sufficient relief at night.    Past Medical History:  Diagnosis Date  . Allergic rhinitis   . Allergy Have always had allergies  . Anemia   . Asthma without status asthmaticus (HHS-HCC)   . Cataract   . Endometriosis   . Fibrocystic breast   . Heart murmur recently diagnosed 2018  . Hemorrhoids   . History of chickenpox   .  History of parasitic infection    Hospitalized x3 in January 2009, states she had a parasitic infection.  . Hyperplastic colon polyp 06/29/2014  . Hypertension   . Meniere's disease   . Posterior tibial tendon dysfunction    S/P surgical correction  . Pulmonary embolism (CMS/HHS-HCC) after foot surgery2015   hospitalized & treated  . Redundant colon 06/29/2014  . Ruptured lumbar disc   . Sciatica   . Venous thromboembolism about 5 yrs ago   left shoulder, post surgery    Past Surgical History:  Procedure Laterality Date  . COLONOSCOPY  01/18/2004  . COLONOSCOPY  05/06/2007   Repeat 5 Years - Dr. Sonia, [facility]  . Foot surgery  05/07/2012   Flatfoot reconstruction, posterior tibial dysfunction repair, gastroc recession  . COLONOSCOPY  06/29/2014   Hyperplastic colon polyp/Redundant colon/Repeat 57yrs/MUS  . ARTHROSCOPY SHOULDER Right 04/25/2023   Dr. Edie  . EYE SURGERY     Cataract surgery 2  . HYSTERECTOMY     TAH BSO  . OOPHORECTOMY     At time of hysterectomy  . TONSILLECTOMY     as a child  . Total hysterectomy and BSO  early 1980s   for cyst and endometriosis    Outpatient Medications Prior to Visit  Medication Sig Dispense Refill  . ascorbate calcium/bioflavonoid (ESTER-C ORAL) Take by mouth once daily    . ascorbic acid, vitamin C, (VITAMIN C) 500 MG tablet Take 500 mg by mouth once daily    . aspirin-acetaminophen -caffeine  (EXCEDRIN MIGRAINE) 250-250-65 mg per tablet Take 2 tablets  by mouth every 6 (six) hours as needed for headache.    SABRA azelastine (ASTELIN) 137 mcg nasal spray Place 1 spray into both nostrils 2 (two) times daily 10 mL 1  . calcium carbonate-vitamin D3 (CALTRATE 600+D) 600 mg(1,500mg ) -400 unit tablet Take 1 tablet by mouth 2 (two) times daily with meals    . clobetasoL  (CORMAX ) 0.05 % external solution Apply topically    . cyanocobalamin  (VITAMIN B12) 1000 MCG tablet Take 1,000 mcg by mouth once daily    . diclofenac (VOLTAREN) 1 %  topical gel Apply 2 g topically 4 (four) times daily    . estradioL (ESTRACE) 0.5 MG tablet Take 1 tablet (0.5 mg total) by mouth once daily 90 tablet 3  . famotidine (PEPCID) 20 MG tablet Take 1 tablet by mouth twice daily 60 tablet 11  . FOLIC ACID /MULTIVIT-MIN/LUTEIN (CENTRUM SILVER ORAL) Take by mouth.    . Herbal Supplement Herbal Name:  For vertigo    . hydroCHLOROthiazide  (HYDRODIURIL ) 12.5 MG tablet Take 1 tablet by mouth once daily 90 tablet 3  . lisinopriL -hydroCHLOROthiazide  (ZESTORETIC ) 10-12.5 mg tablet Take 1 tablet by mouth once daily 90 tablet 3  . mupirocin (BACTROBAN) 2 % ointment APPLY TO THE AFFECTED AREA ONCE DAILY AS NEEDED 22 g 0   No facility-administered medications prior to visit.    Allergies  Allergen Reactions  . Cefdinir Abdominal Pain    Other reaction(s): Abdominal Pain  Abdominal Pain    Family History  Problem Relation Name Age of Onset  . Dementia Mother Charolette Dome        in her 42s  . Alcohol abuse Mother Charolette Dome   . Osteoporosis (Thinning of bones) Mother Charolette Dome   . Hip fracture Mother Charolette Dome   . Arthritis Mother Charolette Dome   . Osteoarthritis Mother Charolette Dome   . Alcohol abuse Father Marsa Dome   . Diabetes Maternal Uncle Brew Tomes        Deceased  . Myocardial Infarction (Heart attack) Maternal Uncle Brew Tomes   . Colon cancer Maternal Grandfather Guillermina Sexton   . Cancer Maternal Grandfather Guillermina Sexton   . Allergies Other         Grandparent  . Arthritis Maternal Grandmother Almarie Sexton   . Osteoarthritis Maternal Grandmother Almarie Sexton     Social History   Tobacco Use  . Smoking status: Former    Types: Cigarettes    Passive exposure: Past  . Smokeless tobacco: Never  . Tobacco comments:    Don'tknow exact dates, but about 30 years ago  Vaping Use  . Vaping status: Never Used  Substance Use Topics  . Alcohol use: Not Currently      Review of Systems  Pertinent  positive and negative ROS as documented in HPI.    Objective:   Blood pressure 101/68, pulse 79, temperature 36.7 C (98.1 F), temperature source Oral, height 165.1 cm (5' 5), weight 71.4 kg (157 lb 6.4 oz), SpO2 96%.  Physical Exam Vitals and nursing note reviewed.  Constitutional:      General: She is not in acute distress.    Appearance: Normal appearance. She is not ill-appearing, toxic-appearing or diaphoretic.  HENT:     Head: Normocephalic and atraumatic.     Right Ear: Tympanic membrane and ear canal normal.     Left Ear: Tympanic membrane and ear canal normal.     Nose: Nose normal.     Comments: No TTP of sinuses. Pt reports  fullness with palpation of maxillary sinuses.     Mouth/Throat:     Mouth: Mucous membranes are moist.     Pharynx: Oropharynx is clear. No oropharyngeal exudate or posterior oropharyngeal erythema.     Comments: Hoarse. No trismus. Uvula midline. Managing secretions.  Eyes:     General: No scleral icterus.       Right eye: No discharge.        Left eye: No discharge.     Conjunctiva/sclera: Conjunctivae normal.     Pupils: Pupils are equal, round, and reactive to light.  Cardiovascular:     Rate and Rhythm: Normal rate and regular rhythm.     Heart sounds: Normal heart sounds.  Pulmonary:     Effort: Pulmonary effort is normal. No respiratory distress.     Breath sounds: Normal breath sounds. No wheezing, rhonchi or rales.     Comments: Coughing with deep inspiration. Respiratory exam as documented. Musculoskeletal:     Cervical back: Normal range of motion and neck supple.  Lymphadenopathy:     Cervical: No cervical adenopathy.  Skin:    General: Skin is warm and dry.  Neurological:     Mental Status: She is alert.  Psychiatric:        Mood and Affect: Mood normal.        Behavior: Behavior normal.     Results for orders placed or performed in visit on 02/21/24  CBC w/auto Differential (5 Part)  Result Value Ref Range   WBC (White  Blood Cell Count) 9.6 4.1 - 10.2 10^3/uL   RBC (Red Blood Cell Count) 3.82 (L) 4.04 - 5.48 10^6/uL   Hemoglobin 12.2 12.0 - 15.0 gm/dL   Hematocrit 63.3 64.9 - 47.0 %   MCV (Mean Corpuscular Volume) 95.8 80.0 - 100.0 fl   MCH (Mean Corpuscular Hemoglobin) 31.9 (H) 27.0 - 31.2 pg   MCHC (Mean Corpuscular Hemoglobin Concentration) 33.3 32.0 - 36.0 gm/dL   Platelet Count 684 849 - 450 10^3/uL   RDW-CV (Red Cell Distribution Width) 12.6 11.6 - 14.8 %   MPV (Mean Platelet Volume) 8.4 (L) 9.4 - 12.4 fl   Neutrophils 6.05 1.50 - 7.80 10^3/uL   Lymphocytes 2.08 1.00 - 3.60 10^3/uL   Monocytes 1.16 0.00 - 1.50 10^3/uL   Eosinophils 0.24 0.00 - 0.55 10^3/uL   Basophils 0.04 0.00 - 0.09 10^3/uL   Neutrophil % 63.1 32.0 - 70.0 %   Lymphocyte % 21.7 10.0 - 50.0 %   Monocyte % 12.1 4.0 - 13.0 %   Eosinophil % 2.5 1.0 - 5.0 %   Basophil% 0.4 0.0 - 2.0 %   Immature Granulocyte % 0.2 <=0.7 %   Immature Granulocyte Count 0.02 <=0.06 10^3/L   NOTE: I have reviewed x-ray prior to radiology review of chest; no obvious focal consolidation. Will await radiology review   Assessment/Plan:   1. Bronchospasm with bronchitis, acute, unspecified -     X-ray chest PA and lateral -     CBC w/auto Differential (5 Part) -     ipratropium-albuteroL  (DUO-NEB) nebulizer solution 3 mL -     HYDROcodone -chlorpheniramine (TUSSIONEX) 10-8 mg/5 mL ER suspension; Take 2.5-5 mLs by mouth at bedtime as needed for Cough or Congestion (can take q12hrs if not working or driving)  Dispense: 70 mL; Refill: 0 -     azithromycin (ZITHROMAX) 250 MG tablet; 2 tabs on day one, then 1 tab daily x 4 days  Dispense: 6 tablet; Refill: 0 -  predniSONE  (DELTASONE ) 20 MG tablet; 20mg  BID x 5 days, then 20mg  daily x 5 days  Dispense: 15 tablet; Refill: 0 -     ipratropium-albuteroL  (COMBIVENT RESPIMAT) 20-100 mcg/actuation inhaler; Inhale 1 Puff into the lungs 4 (four) times daily as needed for Wheezing or Shortness of Breath for up to 14  days  Dispense: 4 g; Refill: 0  2. Acute non-recurrent maxillary sinusitis -     azithromycin (ZITHROMAX) 250 MG tablet; 2 tabs on day one, then 1 tab daily x 4 days  Dispense: 6 tablet; Refill: 0  3. Laryngitis, acute -     predniSONE  (DELTASONE ) 20 MG tablet; 20mg  BID x 5 days, then 20mg  daily x 5 days  Dispense: 15 tablet; Refill: 0  Patient is pleasant, cooperative, in no apparent distress. Vitals reviewed - okay. No respiratory distress. Cough with deep inspiration. Given her persistence of sxs despite being on abx, prednisone , and cough medicine - will check CBC, CXR, and give a breathing treatment.   WBC is 9.6. Neutrophils 63.1%. Hemoglobin 12.2. On my review of x-ray, no obvious focal consolidation. Await radiology review. On re-examination following breathing treatment, better air movement, has wheezing on repeat x-ray. Suspect bronchitis, also consider walking PNA. Also with sinusitis and laryngitis. Will add azithromycin for atypical coverage. Additional prednisone  for wheezing/inflammation/shortness of breath. Tussionex for cough relief at night, use with caution, can cause drowsiness. Combivent inhaler for cough/wheezing/shortness of breath. Further symptomatic treatment discussed. Questions addressed. Follow up with PCP if no improvement or new/worsening symptoms. To ED if acutely worsening. Stable for discharge.    Patient was also seen and evaluated by PA student. Reviewed history and physical exam; discussed assessment and plan as medical decision making. I performed hx and physical exam myself. Information documented has been verified by me.     Attestation Statement:   I personally performed the service, non-incident to. (WP)   ASHLEY BRIDGETTE POISSON, PA       Patient Instructions  X-ray report pending.  Please take azithromycin in addition to Augmentin.  Prednisone  as prescribed.  Tussionex for cough relief at night. Take medications as directed. Use tussionex with  caution; may cause drowsiness. Do not drive or work while taking. Take mostly at night; may use during the day if not working.  Combivent as needed for wheezing/shortness of breath.  Warm compresses, steam showers, warm salt water gargles. Rest. Hydrate. Tylenol  for pain relief.  Follow up with PCP if no improvement.  TO ED if acutely worsening.

## 2024-02-26 ENCOUNTER — Other Ambulatory Visit: Payer: Self-pay

## 2024-02-26 ENCOUNTER — Emergency Department

## 2024-02-26 ENCOUNTER — Observation Stay: Admission: EM | Admit: 2024-02-26 | Discharge: 2024-02-28 | Disposition: A | Attending: Student | Admitting: Student

## 2024-02-26 DIAGNOSIS — Z86711 Personal history of pulmonary embolism: Secondary | ICD-10-CM | POA: Insufficient documentation

## 2024-02-26 DIAGNOSIS — R0602 Shortness of breath: Secondary | ICD-10-CM | POA: Diagnosis present

## 2024-02-26 DIAGNOSIS — J40 Bronchitis, not specified as acute or chronic: Secondary | ICD-10-CM | POA: Diagnosis not present

## 2024-02-26 DIAGNOSIS — B9781 Human metapneumovirus as the cause of diseases classified elsewhere: Secondary | ICD-10-CM | POA: Diagnosis not present

## 2024-02-26 DIAGNOSIS — Z87891 Personal history of nicotine dependence: Secondary | ICD-10-CM | POA: Diagnosis not present

## 2024-02-26 DIAGNOSIS — J4541 Moderate persistent asthma with (acute) exacerbation: Secondary | ICD-10-CM

## 2024-02-26 DIAGNOSIS — Z79899 Other long term (current) drug therapy: Secondary | ICD-10-CM | POA: Diagnosis not present

## 2024-02-26 DIAGNOSIS — Z1152 Encounter for screening for COVID-19: Secondary | ICD-10-CM | POA: Diagnosis not present

## 2024-02-26 DIAGNOSIS — J208 Acute bronchitis due to other specified organisms: Secondary | ICD-10-CM | POA: Diagnosis not present

## 2024-02-26 DIAGNOSIS — I10 Essential (primary) hypertension: Secondary | ICD-10-CM | POA: Diagnosis not present

## 2024-02-26 DIAGNOSIS — J441 Chronic obstructive pulmonary disease with (acute) exacerbation: Secondary | ICD-10-CM | POA: Diagnosis not present

## 2024-02-26 LAB — CBC
HCT: 34.7 % — ABNORMAL LOW (ref 36.0–46.0)
Hemoglobin: 12 g/dL (ref 12.0–15.0)
MCH: 32.6 pg (ref 26.0–34.0)
MCHC: 34.6 g/dL (ref 30.0–36.0)
MCV: 94.3 fL (ref 80.0–100.0)
Platelets: 370 K/uL (ref 150–400)
RBC: 3.68 MIL/uL — ABNORMAL LOW (ref 3.87–5.11)
RDW: 12.4 % (ref 11.5–15.5)
WBC: 13.2 K/uL — ABNORMAL HIGH (ref 4.0–10.5)
nRBC: 0 % (ref 0.0–0.2)

## 2024-02-26 LAB — MRSA NEXT GEN BY PCR, NASAL: MRSA by PCR Next Gen: DETECTED — AB

## 2024-02-26 LAB — RESP PANEL BY RT-PCR (RSV, FLU A&B, COVID)  RVPGX2
Influenza A by PCR: NEGATIVE
Influenza B by PCR: NEGATIVE
Resp Syncytial Virus by PCR: NEGATIVE
SARS Coronavirus 2 by RT PCR: NEGATIVE

## 2024-02-26 LAB — BASIC METABOLIC PANEL WITH GFR
Anion gap: 12 (ref 5–15)
BUN: 28 mg/dL — ABNORMAL HIGH (ref 8–23)
CO2: 26 mmol/L (ref 22–32)
Calcium: 9.5 mg/dL (ref 8.9–10.3)
Chloride: 101 mmol/L (ref 98–111)
Creatinine, Ser: 0.93 mg/dL (ref 0.44–1.00)
GFR, Estimated: >60 mL/min (ref >60)
Glucose, Bld: 104 mg/dL — ABNORMAL HIGH (ref 70–99)
Potassium: 3.8 mmol/L (ref 3.5–5.1)
Sodium: 140 mmol/L (ref 135–145)

## 2024-02-26 LAB — MAGNESIUM: Magnesium: 2.3 mg/dL (ref 1.7–2.4)

## 2024-02-26 LAB — PHOSPHORUS: Phosphorus: 2.4 mg/dL — ABNORMAL LOW (ref 2.5–4.6)

## 2024-02-26 LAB — TROPONIN T, HIGH SENSITIVITY: Troponin T High Sensitivity: <15 ng/L (ref 0–19)

## 2024-02-26 LAB — PRO BRAIN NATRIURETIC PEPTIDE: Pro Brain Natriuretic Peptide: 462 pg/mL — ABNORMAL HIGH (ref <300.0)

## 2024-02-26 MED ORDER — BENZONATATE 100 MG PO CAPS
100.0000 mg | ORAL_CAPSULE | Freq: Once | ORAL | Status: AC
  Administered 2024-02-26: 100 mg via ORAL
  Filled 2024-02-26: qty 1

## 2024-02-26 MED ORDER — ONDANSETRON HCL 4 MG PO TABS: 4.0000 mg | ORAL_TABLET | Freq: Four times a day (QID) | ORAL | Status: DC | PRN

## 2024-02-26 MED ORDER — METHYLPREDNISOLONE SODIUM SUCC 125 MG IJ SOLR
125.0000 mg | Freq: Once | INTRAMUSCULAR | Status: AC
  Administered 2024-02-26: 125 mg via INTRAVENOUS
  Filled 2024-02-26: qty 2

## 2024-02-26 MED ORDER — ACETAMINOPHEN 650 MG RE SUPP: 650.0000 mg | Freq: Four times a day (QID) | RECTAL | Status: DC | PRN

## 2024-02-26 MED ORDER — FLUTICASONE FUROATE-VILANTEROL 200-25 MCG/ACT IN AEPB
1.0000 | INHALATION_SPRAY | Freq: Every day | RESPIRATORY_TRACT | Status: DC
  Administered 2024-02-26 – 2024-02-28 (×2): 1 via RESPIRATORY_TRACT
  Filled 2024-02-26: qty 28

## 2024-02-26 MED ORDER — HYDROCOD POLI-CHLORPHE POLI ER 10-8 MG/5ML PO SUER
5.0000 mL | Freq: Two times a day (BID) | ORAL | Status: DC | PRN
  Administered 2024-02-27: 5 mL via ORAL
  Filled 2024-02-26: qty 5

## 2024-02-26 MED ORDER — K PHOS MONO-SOD PHOS DI & MONO 155-852-130 MG PO TABS
500.0000 mg | ORAL_TABLET | Freq: Four times a day (QID) | ORAL | Status: AC
  Administered 2024-02-26: 500 mg via ORAL
  Filled 2024-02-26 (×3): qty 2

## 2024-02-26 MED ORDER — ENOXAPARIN SODIUM 40 MG/0.4ML IJ SOSY
40.0000 mg | PREFILLED_SYRINGE | INTRAMUSCULAR | Status: DC
  Filled 2024-02-26 (×2): qty 0.4

## 2024-02-26 MED ORDER — GUAIFENESIN ER 600 MG PO TB12
600.0000 mg | ORAL_TABLET | Freq: Two times a day (BID) | ORAL | Status: DC
  Administered 2024-02-26 – 2024-02-28 (×4): 600 mg via ORAL
  Filled 2024-02-26 (×4): qty 1

## 2024-02-26 MED ORDER — KETOROLAC TROMETHAMINE 15 MG/ML IJ SOLN
15.0000 mg | Freq: Once | INTRAMUSCULAR | Status: DC
  Filled 2024-02-26: qty 1

## 2024-02-26 MED ORDER — IPRATROPIUM-ALBUTEROL 0.5-2.5 (3) MG/3ML IN SOLN
3.0000 mL | Freq: Once | RESPIRATORY_TRACT | Status: AC
  Administered 2024-02-26: 3 mL via RESPIRATORY_TRACT
  Filled 2024-02-26: qty 3

## 2024-02-26 MED ORDER — BUTALBITAL-APAP-CAFFEINE 50-325-40 MG PO TABS
1.0000 | ORAL_TABLET | Freq: Four times a day (QID) | ORAL | Status: DC | PRN
  Administered 2024-02-28: 1 via ORAL
  Filled 2024-02-26: qty 1

## 2024-02-26 MED ORDER — SODIUM CHLORIDE 0.9% FLUSH
3.0000 mL | Freq: Two times a day (BID) | INTRAVENOUS | Status: DC
  Administered 2024-02-26 – 2024-02-28 (×4): 3 mL via INTRAVENOUS

## 2024-02-26 MED ORDER — ONDANSETRON HCL 4 MG/2ML IJ SOLN: 4.0000 mg | Freq: Four times a day (QID) | INTRAMUSCULAR | Status: DC | PRN

## 2024-02-26 MED ORDER — BENZONATATE 100 MG PO CAPS: 100.0000 mg | ORAL_CAPSULE | Freq: Three times a day (TID) | ORAL | Status: DC | PRN

## 2024-02-26 MED ORDER — HYDROCHLOROTHIAZIDE 12.5 MG PO TABS
12.5000 mg | ORAL_TABLET | Freq: Every day | ORAL | Status: DC
  Administered 2024-02-27 – 2024-02-28 (×2): 12.5 mg via ORAL
  Filled 2024-02-26 (×2): qty 1

## 2024-02-26 MED ORDER — SODIUM CHLORIDE 0.9 % IV BOLUS
500.0000 mL | Freq: Once | INTRAVENOUS | Status: AC
  Administered 2024-02-26: 500 mL via INTRAVENOUS

## 2024-02-26 MED ORDER — IOHEXOL 350 MG/ML SOLN
75.0000 mL | Freq: Once | INTRAVENOUS | Status: AC | PRN
  Administered 2024-02-26: 75 mL via INTRAVENOUS

## 2024-02-26 MED ORDER — LORATADINE 10 MG PO TABS
10.0000 mg | ORAL_TABLET | Freq: Every day | ORAL | Status: DC
  Administered 2024-02-26 – 2024-02-28 (×3): 10 mg via ORAL
  Filled 2024-02-26 (×3): qty 1

## 2024-02-26 MED ORDER — DOXYCYCLINE HYCLATE 100 MG PO TABS
100.0000 mg | ORAL_TABLET | Freq: Two times a day (BID) | ORAL | Status: DC
  Administered 2024-02-26 – 2024-02-28 (×4): 100 mg via ORAL
  Filled 2024-02-26 (×4): qty 1

## 2024-02-26 MED ORDER — PANTOPRAZOLE SODIUM 40 MG PO TBEC
40.0000 mg | DELAYED_RELEASE_TABLET | Freq: Every day | ORAL | Status: DC
  Administered 2024-02-26 – 2024-02-28 (×3): 40 mg via ORAL
  Filled 2024-02-26 (×3): qty 1

## 2024-02-26 MED ORDER — IPRATROPIUM-ALBUTEROL 0.5-2.5 (3) MG/3ML IN SOLN
3.0000 mL | Freq: Four times a day (QID) | RESPIRATORY_TRACT | Status: DC
  Administered 2024-02-26 – 2024-02-28 (×8): 3 mL via RESPIRATORY_TRACT
  Filled 2024-02-26 (×8): qty 3

## 2024-02-26 MED ORDER — LISINOPRIL-HYDROCHLOROTHIAZIDE 10-12.5 MG PO TABS: 1.0000 | ORAL_TABLET | Freq: Every day | ORAL | Status: DC

## 2024-02-26 MED ORDER — LISINOPRIL 10 MG PO TABS
10.0000 mg | ORAL_TABLET | Freq: Every day | ORAL | Status: DC
  Administered 2024-02-27 – 2024-02-28 (×2): 10 mg via ORAL
  Filled 2024-02-26 (×2): qty 1

## 2024-02-26 MED ORDER — SODIUM CHLORIDE 0.9 % IV SOLN
1.0000 g | INTRAVENOUS | Status: DC
  Administered 2024-02-26 – 2024-02-27 (×2): 1 g via INTRAVENOUS
  Filled 2024-02-26 (×3): qty 10

## 2024-02-26 MED ORDER — ACETAMINOPHEN 325 MG PO TABS: 650.0000 mg | ORAL_TABLET | Freq: Four times a day (QID) | ORAL | Status: DC | PRN

## 2024-02-26 MED ORDER — PREDNISONE 20 MG PO TABS
40.0000 mg | ORAL_TABLET | Freq: Every day | ORAL | Status: DC
  Administered 2024-02-27 – 2024-02-28 (×2): 40 mg via ORAL
  Filled 2024-02-26 (×2): qty 2

## 2024-02-26 NOTE — ED Triage Notes (Signed)
 Pt presents to the ED via POV. Pt reports going to the Dr two weeks ago and being diagnosed with bronchitis. Pt states that she has been on antibiotics for two weeks. Reports continued [facility] and states that she is now hoarse. Reports productive cough and feels like she is wheezing.

## 2024-02-26 NOTE — H&P (Signed)
 [facility] History and Physical   Patient: Sharon Watson FMW:[id number]   PCP: Valora Lynwood FALCON, MD DOB: 1945-11-07   DOA: 02/26/2024   DOS: 02/26/2024   DOS: the patient was seen and examined on 02/26/2024  Patient coming from: The patient is coming from Home  Chief Complaint: Shortness of breath with productive cough over 2 weeks  HPI: Sharon Watson is a 78 y.o. female with PMH of COPD, asthma, HTN, Mnire's disease with vertigo, PE after foot fracture in 2014, not on [facility], presented at [facility] ED with complaining of productive cough for over 2 weeks.  Initially patient developed shortness of breath in [place]  she went to urgent care where she was diagnosed with bronchitis, received antibiotics and breathing treatments which helped her but she again developed cough so she went to walk-in clinic and she was given diagnosis of bronchitis, and prescribed azithromycin, prednisone  and inhalers but her condition is not getting better so she came in for ED for further evaluation.  Patient feels sinus congestion, hoarseness of voice and wheezes.  Phlegm is yellow color.  Denies any fever, no chest pain, no abdominal pain, no any other complaints   ED Course: Vital signs stable CMP BG 104, BUN 28, phosphorus 2.8, rest within normal range BNP 462, slightly elevated Troponin x 1 negative CBC: WBC 13.2 elevated CTA negative for PE, no pneumonia, shows bronchitis CXR negative for any infection   Review of Systems: as mentioned in the history of present illness.  All other systems reviewed and are negative.  Past Medical History:  Diagnosis Date   Allergic rhinitis    Anemia    Asthma    as a child   Cataracts, bilateral    Colon polyp    COPD (chronic obstructive pulmonary disease) (HCC)    Endometriosis    Essential hypertension    Meniere's disease    Non-rheumatic mitral regurgitation    Nontraumatic incomplete tear of right rotator cuff 03/2023   Parasitic infection 2009    hospitalized x3   Pneumonia    Pulmonary embolism (HCC) 05/13/2012   after foot surgery   Ruptured lumbar disc    Stage 3a chronic kidney disease (CKD) (HCC)    Vertigo    requires to sit up SLOWLY to avoid vertigo episodes   Past Surgical History:  Procedure Laterality Date   CATARACT EXTRACTION W/ INTRAOCULAR LENS  IMPLANT, BILATERAL     COLONOSCOPY     2005, 2009, 2016   FOOT SURGERY Right 05/07/2012   flat foot reconstruction   SHOULDER ARTHROSCOPY WITH SUBACROMIAL DECOMPRESSION, ROTATOR CUFF REPAIR AND BICEP TENDON REPAIR Right 04/25/2023   Procedure: RIGHT SHOULDER ARTHROSCOPY WITH DEBRIDEMENT, DECOMPRESSION, ROTATOR CUFF REPAIR, AND PROBABLE BICEPS TENODESIS. - RNFA;  Surgeon: Edie Norleen PARAS, MD;  Location: [facility] ORS;  Service: Orthopedics;  Laterality: Right;   TONSILLECTOMY     childhood   TOTAL ABDOMINAL HYSTERECTOMY W/ BILATERAL SALPINGOOPHORECTOMY     Social History:  reports that she has quit smoking. Her smoking use included cigarettes. She has never used smokeless tobacco. She reports current alcohol use. She reports that she does not use drugs.  Allergies  Allergen Reactions   Cefdinir     Abdominal Pain     Family history reviewed and not pertinent Family History  Problem Relation Age of Onset   Breast cancer Neg Hx      Prior to Admission medications   Medication Sig Start Date End Date Taking? Authorizing Provider  ascorbic acid (VITAMIN  C) 500 MG tablet Take 500 mg by mouth daily.   Yes [provider]  aspirin-acetaminophen -caffeine  (EXCEDRIN MIGRAINE) 250-250-65 MG tablet Take 2 tablets by mouth every 6 (six) hours as needed for headache.   Yes [provider]  Azelastine HCl 137 MCG/SPRAY SOLN Place 1 spray into both nostrils 2 (two) times daily. 09/08/21  Yes [provider]  Calcium Carb-Cholecalciferol (CALCIUM 600 + D PO) Take 1 tablet by mouth daily.   Yes [provider]  chlorpheniramine-HYDROcodone  (TUSSIONEX)  10-8 MG/5ML Take 2.5-5 mLs by mouth at bedtime as needed for cough. 02/21/24  Yes [provider]  estradiol (ESTRACE) 0.5 MG tablet Take 0.5 mg by mouth daily. 11/18/19  Yes [provider]  famotidine (PEPCID) 20 MG tablet Take 20 mg by mouth at bedtime. 10/21/20  Yes [provider]  Ibuprofen-Acetaminophen  (MOTRIN DUAL ACTION) 125-250 MG TABS Take 2 tablets by mouth at bedtime.   Yes [provider]  Ipratropium-Albuterol  (COMBIVENT) 20-100 MCG/ACT AERS respimat Inhale 1 puff into the lungs every 4 (four) hours as needed. 02/21/24 03/06/24 Yes [provider]  lisinopril -hydrochlorothiazide  (ZESTORETIC ) 10-12.5 MG tablet Take 1 tablet by mouth daily. 11/18/19  Yes [provider]  Methylcobalamin (METHYL B-12 PO) Take 1,000 mcg by mouth daily.   Yes [provider]  Multiple Vitamin (MULTIVITAMIN WITH MINERALS) TABS tablet Take 1 tablet by mouth daily.   Yes [provider]  predniSONE  (DELTASONE ) 20 MG tablet Take 20 mg by mouth 2 (two) times daily with a meal. 02/21/24  Yes [provider]  Probiotic Product (PROBIOTIC PO) Take 1 capsule by mouth daily.   Yes [provider]  azithromycin (ZITHROMAX) 250 MG tablet Take 250 mg by mouth.  2 tabs on day one, then 1 tab daily x 4 days Patient not taking: Reported on 02/26/2024 02/21/24 02/26/24  [provider]  clobetasol  (TEMOVATE ) 0.05 % external solution Mix into CeraVe cream as directed. Apply to aa's rash QD-BID PRN flares. Patient not taking: Reported on 02/26/2024 02/03/21   Hester Alm BROCKS, MD  hydrochlorothiazide  (HYDRODIURIL ) 12.5 MG tablet Take 12.5 mg by mouth daily. Patient not taking: Reported on 02/26/2024 04/04/23   [provider]  OVER THE COUNTER MEDICATION Take 2 capsules by mouth daily. Vertisyl otc vertigo relief    [provider]  oxyCODONE  (ROXICODONE ) 5 MG immediate release tablet Take 1-2 tablets (5-10 mg total) by  mouth every 4 (four) hours as needed for moderate pain (pain score 4-6) or severe pain (pain score 7-10). Patient not taking: Reported on 02/26/2024 04/25/23   Edie Norleen PARAS, MD    Physical Exam: Vitals:   02/26/24 1237 02/26/24 1238  BP: 120/77   Pulse: 65   Resp: 18   Temp: 97.9 F (36.6 C)   TempSrc: Oral   SpO2: 97%   Weight:  70.3 kg  Height:  5' 5 (1.651 m)    General: alert and oriented to time, place, and person. Appear in mild distress, affect appropriate Eyes: PERRLA, Conjunctiva normal ENT: Oral Mucosa Clear, moist  Neck: no JVD, no Abnormal Mass Or lumps Cardiovascular: S1 and S2 Present, no Murmur, peripheral pulses symmetrical Respiratory: Mildly increased respiratory effort, Bilateral Air entry equal and Decreased, no signs of accessory muscle use, no Crackles, but mild wheezes b/l Abdomen: Bowel Sound present, Soft and no tenderness. Skin: no rashes  Extremities: no Pedal edema, no calf tenderness Neurologic: without any new focal findings Gait not checked due to patient safety  concerns  Data Reviewed: I have personally reviewed and interpreted labs, imaging as discussed below.  CBC: Recent Labs  Lab 02/26/24 1243  WBC 13.2*  HGB 12.0  HCT 34.7*  MCV 94.3  PLT 370   Basic Metabolic Panel: Recent Labs  Lab 02/26/24 1243  NA 140  K 3.8  CL 101  CO2 26  GLUCOSE 104*  BUN 28*  CREATININE 0.93  CALCIUM 9.5   GFR: Estimated Creatinine Clearance: 49 mL/min (by C-G formula based on SCr of 0.93 mg/dL). Liver Function Tests: No results for input(s): AST, ALT, ALKPHOS, BILITOT, PROT, ALBUMIN in the last 168 hours. No results for input(s): LIPASE, AMYLASE in the last 168 hours. No results for input(s): AMMONIA in the last 168 hours. Coagulation Profile: No results for input(s): INR, PROTIME in the last 168 hours. Cardiac Enzymes: No results for input(s): CKTOTAL, CKMB, CKMBINDEX, TROPONINI in the last 168 hours. BNP  (last 3 results) Recent Labs    02/26/24 1449  PROBNP 462.0*   HbA1C: No results for input(s): HGBA1C in the last 72 hours. CBG: No results for input(s): GLUCAP in the last 168 hours. Lipid Profile: No results for input(s): CHOL, HDL, LDLCALC, TRIG, CHOLHDL, LDLDIRECT in the last 72 hours. Thyroid  Function Tests: No results for input(s): TSH, T4TOTAL, FREET4, T3FREE, THYROIDAB in the last 72 hours. Anemia Panel: No results for input(s): VITAMINB12, FOLATE, FERRITIN, TIBC, IRON, RETICCTPCT in the last 72 hours. Urine analysis:    Component Value Date/Time   COLORURINE YELLOW (A) 08/13/2021 0020   APPEARANCEUR CLOUDY (A) 08/13/2021 0020   APPEARANCEUR Cloudy 03/24/2014 2215   LABSPEC 1.019 08/13/2021 0020   LABSPEC 1.016 03/24/2014 2215   PHURINE 7.0 08/13/2021 0020   GLUCOSEU NEGATIVE 08/13/2021 0020   GLUCOSEU Negative 03/24/2014 2215   HGBUR LARGE (A) 08/13/2021 0020   BILIRUBINUR NEGATIVE 08/13/2021 0020   BILIRUBINUR Negative 03/24/2014 2215   KETONESUR NEGATIVE 08/13/2021 0020   PROTEINUR 100 (A) 08/13/2021 0020   NITRITE NEGATIVE 08/13/2021 0020   LEUKOCYTESUR LARGE (A) 08/13/2021 0020   LEUKOCYTESUR Negative 03/24/2014 2215    Radiological Exams on Admission: CT Angio Chest PE W and/or Wo Contrast Result Date: 02/26/2024 CLINICAL DATA:  Short of breath, history of pulmonary embolus, bronchitis EXAM: CT ANGIOGRAPHY CHEST WITH CONTRAST TECHNIQUE: Multidetector CT imaging of the chest was performed using the standard protocol during bolus administration of intravenous contrast. Multiplanar CT image reconstructions and MIPs were obtained to evaluate the vascular anatomy. RADIATION DOSE REDUCTION: This exam was performed according to the departmental dose-optimization program which includes automated exposure control, adjustment of the mA and/or kV according to patient size and/or use of iterative reconstruction technique. CONTRAST:  75mL  OMNIPAQUE  IOHEXOL  350 MG/ML SOLN COMPARISON:  10/13/2020 FINDINGS: Cardiovascular: This is a technically adequate evaluation of the pulmonary vasculature. No filling defects or pulmonary emboli. Dilated main pulmonary artery measuring up to 3.5 cm, consistent with pulmonary arterial hypertension. The heart is enlarged without pericardial effusion. No evidence of thoracic aortic aneurysm or dissection. Atherosclerosis of the aorta and coronary vasculature. Mediastinum/Nodes: No enlarged mediastinal, hilar, or axillary lymph nodes. Thyroid  gland, trachea, and esophagus demonstrate no significant findings. Lungs/Pleura: There is bilateral bronchial wall thickening consistent with bronchitis. No airspace disease, effusion, or pneumothorax. Upper Abdomen: No acute upper abdominal findings. Stable left adrenal adenoma. Musculoskeletal: No acute or destructive bony abnormalities. Reconstructed images demonstrate no additional findings. Review of the MIP images confirms the above findings. IMPRESSION: 1. No evidence of pulmonary embolus. 2. Bilateral bronchial  wall thickening consistent with bronchitis. No evidence of pneumonia. 3. Dilated main pulmonary artery consistent with pulmonary arterial hypertension. 4. Aortic Atherosclerosis (ICD10-I70.0). Coronary artery atherosclerosis. 5. Cardiomegaly. Electronically Signed   By: Ozell Daring M.D.   On: 02/26/2024 16:12   DG Chest 2 View Result Date: 02/26/2024 CLINICAL DATA:  Short of breath EXAM: CHEST - 2 VIEW COMPARISON:  10/13/2020 FINDINGS: Frontal and lateral views of the chest demonstrate an unremarkable cardiac silhouette. No acute airspace disease, effusion, or pneumothorax. No acute bony abnormalities. IMPRESSION: 1. No acute intrathoracic process. Electronically Signed   By: Ozell Daring M.D.   On: 02/26/2024 15:07   EKG: Independently reviewed.  Normal sinus rhythm, possible left atrial enlargement, LVH, prolonged QTc 406  I reviewed all nursing notes,  pharmacy notes, vitals, pertinent old records.  Assessment/Plan Principal Problem:   Bronchitis Active Problems:   COPD with acute exacerbation (HCC)   # Acute COPD exacerbation secondary to bronc Supplemental O2 and elation as needed Follow COVID, flu and RSV, follow RVP panel S/p Solu-Medrol  125 mg x 1 dose given in the ED Started prednisone  40 mg p.o. daily Started ceftriaxone  1 g IV daily and doxycycline  100 mg p.o. twice daily Mucinex  600 mg p.o. twice daily, Tussionex as needed, Tessalon  Perles as needed DuoNeb every 6 hourly scheduled Breo Ellipta  inhaler Claritin  10 mg p.o. daily  # Hypophosphatemia, Phos repleted Monitor electrolytes and replete as needed.  # Hypertension BP stable Resume home medication Monitor BP and titrate medications according  H/o Mnire's disease and vertigo No active disease    Nutrition: Regular diet DVT Prophylaxis: Subcutaneous Lovenox   Advance goals of care discussion: Full code   Consults: None  Family Communication: family was present at bedside, at the time of interview.  Opportunity was given to ask question and all questions were answered satisfactorily.  Disposition: Admitted as observation, med-surge unit. Likely to be discharged Home, in 1-2 days when stable.  I have discussed plan of care as described above with RN and patient/family.  Severity of Illness: The appropriate patient status for this patient is OBSERVATION. Observation status is judged to be reasonable and necessary in order to provide the required intensity of service to ensure the patient's safety. The patient's presenting symptoms, physical exam findings, and initial radiographic and laboratory data in the context of their medical condition is felt to place them at decreased risk for further clinical deterioration. Furthermore, it is anticipated that the patient will be medically stable for discharge from the hospital within 2 midnights of admission.     Author: ELVAN SOR, MD [facility] 02/26/2024 5:13 PM   To reach On-call, see care teams to locate the attending and reach out to them via [email or web address]. If 7PM-7AM, please contact night-coverage If you still have difficulty reaching the attending provider, please page the [facility] (Director on Call) for [facility] on amion for assistance.

## 2024-02-26 NOTE — ED Provider Notes (Signed)
 Clinical Course as of 02/26/24 1648  Tue Feb 26, 2024  1435 Patient lungs do sound much more clear after receiving the nebulizer however I reviewed her chart further she has a history of a PE in the past and she tells me she still feels fairly symptomatic.  Given the findings will obtain CTA of the chest to further assess for possible PE and we will attempt some more further symptomatic management with Toradol  and fluids. [SK]  1500 Received signout: hx of PE (not on AC), COPD, ESED and vertigo--dealing with cough, prescribed Augmentin , azithromyicn, increased sob.  Already completed two courses of steroids [HD]  1637 CT PE consistent with chronic bronchitis.  I went and reassessed the patient and she is very symptomatic repeat pulmonary exam demonstrates not good movement available and wheezes throughout.  She got winded just turning to the side letting me to examine her.  She definitely has exertional dyspnea.  She has failed outpatient treatment at this point will discuss admission with hospitalist [HD]  1648 Case discussed with hospitalist for admission [HD]    Clinical Course User Index [HD] Nicholaus Rolland BRAVO, MD [SK] Fernand Rossie HERO, MD      Nicholaus Rolland BRAVO, MD 02/26/24 [phone]

## 2024-02-26 NOTE — ED Provider Notes (Signed)
 [facility] Provider Note    Event Date/Time   First MD Initiated Contact with Patient 02/26/24 1323     (approximate)   History   Shortness of Breath   HPI  Samella Lucchetti is a 78 y.o. female who presents today with concern of shortness of breath.  Patient states that her husband recently developed pneumonia, and recently afterwards she also developed similar symptoms.  She went to a walk-in clinic in [place] , which she was prescribed Augmentin, however continued coughing up productive sputum yellowish colored, she then went to a local clinic here, where she was prescribed azithromycin as well as prednisone  and an inhaler.  She felt very minimal relief, although while at the facility she was given a nebulizer treatment which helped open her up and made her feel much better.  She states that she does have a history of childhood asthma, but does not regularly need treatment for this, but she feels like she is having a flareup at this time.  Presents now for continued symptoms without much improvement.  Denies fevers chills no associate chest pain.      Physical Exam   Triage Vital Signs: ED Triage Vitals  Encounter Vitals Group     BP 02/26/24 1237 120/77     Girls Systolic BP Percentile --      Girls Diastolic BP Percentile --      Boys Systolic BP Percentile --      Boys Diastolic BP Percentile --      Pulse Rate 02/26/24 1237 65     Resp 02/26/24 1237 18     Temp 02/26/24 1237 97.9 F (36.6 C)     Temp Source 02/26/24 1237 Oral     SpO2 02/26/24 1237 97 %     Weight 02/26/24 1238 155 lb (70.3 kg)     Height 02/26/24 1238 5' 5 (1.651 m)     Head Circumference --      Peak Flow --      Pain Score 02/26/24 1237 0     Pain Loc --      Pain Education --      Exclude from Growth Chart --     Most recent vital signs: Vitals:   02/26/24 1237  BP: 120/77  Pulse: 65  Resp: 18  Temp: 97.9 F (36.6 C)  SpO2: 97%     General: Awake, no  distress.  CV:  Good peripheral perfusion.  Resp:  Normal effort.  Diffuse wheezes, and does have some increased work of breathing during conversation Abd:  No distention.  Soft nontender Other:     ED Results / Procedures / Treatments   Labs (all labs ordered are listed, but only abnormal results are displayed) Labs Reviewed  BASIC METABOLIC PANEL WITH GFR - Abnormal; Notable for the following components:      Result Value   Glucose, Bld 104 (*)    BUN 28 (*)    All other components within normal limits  CBC - Abnormal; Notable for the following components:   WBC 13.2 (*)    RBC 3.68 (*)    HCT 34.7 (*)    All other components within normal limits  PRO BRAIN NATRIURETIC PEPTIDE  TROPONIN T, HIGH SENSITIVITY     EKG  Sinus rhythm with rate of about 65, axis of about 45, nonspecific T wave inversions noted in leads aVL and V1 and V2.  I do not appreciate any obvious ischemia   RADIOLOGY  No acute findings  PROCEDURES:  Critical Care performed: No  Procedures   MEDICATIONS ORDERED IN ED: Medications  ketorolac  (TORADOL ) 15 MG/ML injection 15 mg (has no administration in time range)  sodium chloride  0.9 % bolus 500 mL (has no administration in time range)  ipratropium-albuterol  (DUONEB) 0.5-2.5 (3) MG/3ML nebulizer solution 3 mL (3 mLs Nebulization Given 02/26/24 1407)     IMPRESSION / MDM / ASSESSMENT AND PLAN / ED COURSE  I reviewed the triage vital signs and the nursing notes.                               Patient's presentation is most consistent with acute presentation with potential threat to life or bodily function.  78 year old female presenting with concern of persistent cough congestion shortness of breath ongoing for over 2 weeks.  At rest she appears well however during conversation she does become increasingly tachypneic.  I reviewed her chart and her recent visits, she does have a history of COPD and also pulmonary emboli, however her clinical exam  appears more likely consistent with a viral URI picture.  She has completed 2 courses of antibiotics as well as steroids.  Has a slight leukocytosis here but I suspect is likely secondary to the steroid treatment.  Her vitals here are stable and reassuring, however her tachypnea with conversation and her shortness of breath with exertion are concerning.  I suspect more likely exacerbation of COPD versus asthma versus viral URI.  I feel clinically very unlikely PE.  We will provide multiple nebulizers here and continue to monitor.  If she continues to become tachypneic, may warrant admission for further evaluation otherwise may be reasonable for discharge home.   Clinical Course as of 02/26/24 1437  Tue Feb 26, 2024  1435 Patient lungs do sound much more clear after receiving the nebulizer however I reviewed her chart further she has a history of a PE in the past and she tells me she still feels fairly symptomatic.  Given the findings will obtain CTA of the chest to further assess for possible PE and we will attempt some more further symptomatic management with Toradol  and fluids. [SK]    Clinical Course User Index [SK] Fernand Rossie HERO, MD     FINAL CLINICAL IMPRESSION(S) / ED DIAGNOSES   Final diagnoses:  SOB (shortness of breath)  Moderate persistent asthma with exacerbation     Rx / DC Orders   ED Discharge Orders     None        Note:  This document was prepared using Dragon voice recognition software and may include unintentional dictation errors.   Fernand Rossie HERO, MD 02/26/24 [phone]

## 2024-02-27 DIAGNOSIS — J40 Bronchitis, not specified as acute or chronic: Secondary | ICD-10-CM | POA: Diagnosis not present

## 2024-02-27 LAB — BASIC METABOLIC PANEL WITH GFR
Anion gap: 14 (ref 5–15)
BUN: 27 mg/dL — ABNORMAL HIGH (ref 8–23)
CO2: 23 mmol/L (ref 22–32)
Calcium: 9.3 mg/dL (ref 8.9–10.3)
Chloride: 101 mmol/L (ref 98–111)
Creatinine, Ser: 0.89 mg/dL (ref 0.44–1.00)
GFR, Estimated: >60 mL/min (ref >60)
Glucose, Bld: 216 mg/dL — ABNORMAL HIGH (ref 70–99)
Potassium: 4.1 mmol/L (ref 3.5–5.1)
Sodium: 138 mmol/L (ref 135–145)

## 2024-02-27 LAB — CBC
HCT: 33.5 % — ABNORMAL LOW (ref 36.0–46.0)
Hemoglobin: 11.2 g/dL — ABNORMAL LOW (ref 12.0–15.0)
MCH: 32.2 pg (ref 26.0–34.0)
MCHC: 33.4 g/dL (ref 30.0–36.0)
MCV: 96.3 fL (ref 80.0–100.0)
Platelets: 339 K/uL (ref 150–400)
RBC: 3.48 MIL/uL — ABNORMAL LOW (ref 3.87–5.11)
RDW: 12.4 % (ref 11.5–15.5)
WBC: 7.9 K/uL (ref 4.0–10.5)
nRBC: 0 % (ref 0.0–0.2)

## 2024-02-27 LAB — RESPIRATORY PANEL BY PCR

## 2024-02-27 LAB — PHOSPHORUS: Phosphorus: 2.2 mg/dL — ABNORMAL LOW (ref 2.5–4.6)

## 2024-02-27 LAB — MAGNESIUM: Magnesium: 2.3 mg/dL (ref 1.7–2.4)

## 2024-02-27 MED ORDER — BENZONATATE 100 MG PO CAPS: 100.0000 mg | ORAL_CAPSULE | Freq: Three times a day (TID) | ORAL | Status: DC | PRN

## 2024-02-27 MED ORDER — BENZONATATE 100 MG PO CAPS
100.0000 mg | ORAL_CAPSULE | Freq: Three times a day (TID) | ORAL | Status: AC
  Administered 2024-02-27 (×2): 100 mg via ORAL
  Filled 2024-02-27 (×2): qty 1

## 2024-02-27 MED ORDER — K PHOS MONO-SOD PHOS DI & MONO 155-852-130 MG PO TABS
500.0000 mg | ORAL_TABLET | Freq: Four times a day (QID) | ORAL | Status: AC
  Administered 2024-02-27 (×2): 500 mg via ORAL
  Filled 2024-02-27 (×3): qty 2

## 2024-02-27 MED ORDER — MENTHOL 3 MG MT LOZG
1.0000 | LOZENGE | Freq: Three times a day (TID) | OROMUCOSAL | Status: AC
  Administered 2024-02-27 (×2): 3 mg via ORAL
  Filled 2024-02-27: qty 9

## 2024-02-27 NOTE — Plan of Care (Signed)
  Problem: Education: Goal: Knowledge of General Education information will improve Description: Including pain rating scale, medication(s)/side effects and non-pharmacologic comfort measures Outcome: Progressing   Problem: Clinical Measurements: Goal: Ability to maintain clinical measurements within normal limits will improve Outcome: Progressing Goal: Will remain free from infection Outcome: Progressing Goal: Diagnostic test results will improve Outcome: Progressing Goal: Respiratory complications will improve Outcome: Progressing Goal: Cardiovascular complication will be avoided Outcome: Progressing   Problem: Activity: Goal: Risk for activity intolerance will decrease Outcome: Progressing   Problem: Nutrition: Goal: Adequate nutrition will be maintained Outcome: Progressing   Problem: Safety: Goal: Ability to remain free from injury will improve Outcome: Progressing   Problem: Skin Integrity: Goal: Risk for impaired skin integrity will decrease Outcome: Progressing   

## 2024-02-27 NOTE — Progress Notes (Signed)
 [facility] Progress Note  Patient: Sharon Watson    FMW:[id number]  DOA: 02/26/2024     Date of Service: the patient was seen and examined on 02/27/2024  Chief Complaint  Patient presents with   Shortness of Breath   Brief hospital course: Sharon Watson is a 78 y.o. female with PMH of COPD, asthma, HTN, Mnire's disease with vertigo, PE after foot fracture in 2014, not on [facility], presented at [facility] ED with complaining of productive cough for over 2 weeks.  Initially patient developed shortness of breath in [place]  she went to urgent care where she was diagnosed with bronchitis, received antibiotics and breathing treatments which helped her but she again developed cough so she went to walk-in clinic and she was given diagnosis of bronchitis, and prescribed azithromycin, prednisone  and inhalers but her condition is not getting better so she came in for ED for further evaluation.  Patient feels sinus congestion, hoarseness of voice and wheezes.  Phlegm is yellow color.  Denies any fever, no chest pain, no abdominal pain, no any other complaints     ED Course: Vital signs stable CMP BG 104, BUN 28, phosphorus 2.8, rest within normal range BNP 462, slightly elevated Troponin x 1 negative CBC: WBC 13.2 elevated CTA negative for PE, no pneumonia, shows bronchitis CXR negative for any infection  Assessment and Plan:  # Acute COPD exacerbation 2/2 metapneumovirus bronchitis and possible bacterial superinfection Supplemental O2 and elation as needed Negative COVID, flu and RSV,  Positive metapneumovirus (RVP panel) S/p Solu-Medrol  125 mg x 1 dose given in the ED Started prednisone  40 mg p.o. daily Started ceftriaxone  1 g IV daily and doxycycline  100 mg p.o. twice daily Mucinex  600 mg p.o. twice daily, Tussionex as needed, Tessalon  Perles as needed DuoNeb every 6 hourly scheduled Breo Ellipta  inhaler Claritin  10 mg p.o. daily 12/10 Cepacol lozenges 3 times daily x 2 doses given   #  Hypophosphatemia, Phos repleted Monitor electrolytes and replete as needed.   # Hypertension BP stable Resume home medication Monitor BP and titrate medications according   H/o Mnire's disease and vertigo No active disease   Body mass index is 25.79 kg/m.  Interventions:  Diet: Regular diet DVT Prophylaxis: Subcutaneous Lovenox    Advance goals of care discussion: Full code  Family Communication: family was present at bedside, at the time of interview.  The pt provided permission to discuss medical plan with the family. Opportunity was given to ask question and all questions were answered satisfactorily.   Disposition:  Pt is from Home, admitted with copd exacerbation due to metapneumovirus infection, still has significant respiratory symptoms, which precludes a safe discharge. Discharge to home, when stable, may need 1-2 more days to improve.  Subjective:   Physical Exam: General: NAD, lying comfortably Appear in no distress, affect appropriate Eyes: PERRLA ENT: Oral Mucosa Clear, moist  Neck: no JVD,  Cardiovascular: S1 and S2 Present, no Murmur,  Respiratory: Equal air entry bilaterally, b/l wheezes, mild crackles Abdomen: BS +, Soft and no tenderness,  Skin: no rashes Extremities: no Pedal edema, no calf tenderness Neurologic: without any new focal findings Gait not checked due to patient safety concerns  Vitals:   02/27/24 0254 02/27/24 0437 02/27/24 0803 02/27/24 0827  BP:  130/71 134/81   Pulse:  80 74   Resp:  17 18   Temp:  (!) 97.4 F (36.3 C) 98 F (36.7 C)   TempSrc:  Oral Oral   SpO2: 94% 94% 93% 94%  Weight:  Height:        Intake/Output Summary (Last 24 hours) at 02/27/2024 1439 Last data filed at 02/27/2024 0900 Gross per 24 hour  Intake 720 ml  Output --  Net 720 ml   Filed Weights   02/26/24 1238  Weight: 70.3 kg    Data Reviewed: I have personally reviewed and interpreted daily labs, tele strips, imagings as discussed  above. I reviewed all nursing notes, pharmacy notes, vitals, pertinent old records I have discussed plan of care as described above with RN and patient/family.  CBC: Recent Labs  Lab 02/26/24 1243 02/27/24 0422  WBC 13.2* 7.9  HGB 12.0 11.2*  HCT 34.7* 33.5*  MCV 94.3 96.3  PLT 370 339   Basic Metabolic Panel: Recent Labs  Lab 02/26/24 1243 02/27/24 0422  NA 140 138  K 3.8 4.1  CL 101 101  CO2 26 23  GLUCOSE 104* 216*  BUN 28* 27*  CREATININE 0.93 0.89  CALCIUM 9.5 9.3  MG 2.3 2.3  PHOS 2.4* 2.2*    Studies: CT Angio Chest PE W and/or Wo Contrast Result Date: 02/26/2024 CLINICAL DATA:  Short of breath, history of pulmonary embolus, bronchitis EXAM: CT ANGIOGRAPHY CHEST WITH CONTRAST TECHNIQUE: Multidetector CT imaging of the chest was performed using the standard protocol during bolus administration of intravenous contrast. Multiplanar CT image reconstructions and MIPs were obtained to evaluate the vascular anatomy. RADIATION DOSE REDUCTION: This exam was performed according to the departmental dose-optimization program which includes automated exposure control, adjustment of the mA and/or kV according to patient size and/or use of iterative reconstruction technique. CONTRAST:  75mL OMNIPAQUE  IOHEXOL  350 MG/ML SOLN COMPARISON:  10/13/2020 FINDINGS: Cardiovascular: This is a technically adequate evaluation of the pulmonary vasculature. No filling defects or pulmonary emboli. Dilated main pulmonary artery measuring up to 3.5 cm, consistent with pulmonary arterial hypertension. The heart is enlarged without pericardial effusion. No evidence of thoracic aortic aneurysm or dissection. Atherosclerosis of the aorta and coronary vasculature. Mediastinum/Nodes: No enlarged mediastinal, hilar, or axillary lymph nodes. Thyroid  gland, trachea, and esophagus demonstrate no significant findings. Lungs/Pleura: There is bilateral bronchial wall thickening consistent with bronchitis. No airspace  disease, effusion, or pneumothorax. Upper Abdomen: No acute upper abdominal findings. Stable left adrenal adenoma. Musculoskeletal: No acute or destructive bony abnormalities. Reconstructed images demonstrate no additional findings. Review of the MIP images confirms the above findings. IMPRESSION: 1. No evidence of pulmonary embolus. 2. Bilateral bronchial wall thickening consistent with bronchitis. No evidence of pneumonia. 3. Dilated main pulmonary artery consistent with pulmonary arterial hypertension. 4. Aortic Atherosclerosis (ICD10-I70.0). Coronary artery atherosclerosis. 5. Cardiomegaly. Electronically Signed   By: Ozell Daring M.D.   On: 02/26/2024 16:12    Scheduled Meds:  benzonatate   100 mg Oral TID   doxycycline   100 mg Oral Q12H   enoxaparin  (LOVENOX ) injection  40 mg Subcutaneous Q24H   fluticasone  furoate-vilanterol  1 puff Inhalation Daily   guaiFENesin   600 mg Oral BID   lisinopril   10 mg Oral Daily   And   hydrochlorothiazide   12.5 mg Oral Daily   ipratropium-albuterol   3 mL Nebulization Q6H   ketorolac   15 mg Intravenous Once   loratadine   10 mg Oral Daily   menthol   1 lozenge Oral TID   pantoprazole   40 mg Oral Daily   predniSONE   40 mg Oral Q breakfast   sodium chloride  flush  3 mL Intravenous Q12H   Continuous Infusions:  cefTRIAXone  (ROCEPHIN )  IV 1 g (02/26/24 2021)   PRN  Meds: acetaminophen  **OR** acetaminophen , benzonatate  **FOLLOWED BY** [START ON 02/28/2024] benzonatate , butalbital -acetaminophen -caffeine , chlorpheniramine-HYDROcodone , ondansetron  **OR** ondansetron  (ZOFRAN ) IV  Time spent: 35 minutes  Author: ELVAN SOR. MD [facility] 02/27/2024 2:39 PM  To reach On-call, see care teams to locate the attending and reach out to them via [email or web address]. If 7PM-7AM, please contact night-coverage If you still have difficulty reaching the attending provider, please page the [facility] (Director on Call) for [facility] on amion for assistance.

## 2024-02-27 NOTE — Care Management Obs Status (Signed)
 MEDICARE OBSERVATION STATUS NOTIFICATION   Patient Details  Name: Sharon Watson MRN: [id number] Date of Birth: 1945-10-14   Medicare Observation Status Notification Given:  Yes    Rojelio SHAUNNA Rattler 02/27/2024, 1:50 PM

## 2024-02-28 ENCOUNTER — Other Ambulatory Visit: Payer: Self-pay

## 2024-02-28 DIAGNOSIS — J40 Bronchitis, not specified as acute or chronic: Secondary | ICD-10-CM | POA: Diagnosis not present

## 2024-02-28 MED ORDER — DOXYCYCLINE HYCLATE 100 MG PO TABS
100.0000 mg | ORAL_TABLET | Freq: Two times a day (BID) | ORAL | 0 refills | Status: AC
  Filled 2024-02-28: qty 10, 5d supply, fill #0

## 2024-02-28 MED ORDER — LORATADINE 10 MG PO TABS
10.0000 mg | ORAL_TABLET | Freq: Every day | ORAL | 0 refills | Status: AC
  Filled 2024-02-28: qty 7, 7d supply, fill #0

## 2024-02-28 MED ORDER — PREDNISONE 20 MG PO TABS
40.0000 mg | ORAL_TABLET | Freq: Every day | ORAL | 0 refills | Status: AC
  Filled 2024-02-28: qty 10, 5d supply, fill #0

## 2024-02-28 MED ORDER — PANTOPRAZOLE SODIUM 40 MG PO TBEC
40.0000 mg | DELAYED_RELEASE_TABLET | Freq: Every day | ORAL | 0 refills | Status: AC
  Filled 2024-02-28: qty 7, 7d supply, fill #0

## 2024-02-28 MED ORDER — IPRATROPIUM-ALBUTEROL 0.5-2.5 (3) MG/3ML IN SOLN
3.0000 mL | Freq: Four times a day (QID) | RESPIRATORY_TRACT | 0 refills | Status: AC | PRN
  Filled 2024-02-28: qty 360, 30d supply, fill #0

## 2024-02-28 MED ORDER — GUAIFENESIN ER 600 MG PO TB12
600.0000 mg | ORAL_TABLET | Freq: Two times a day (BID) | ORAL | 0 refills | Status: AC
  Filled 2024-02-28: qty 14, 7d supply, fill #0

## 2024-02-28 MED ORDER — AMOXICILLIN-POT CLAVULANATE 875-125 MG PO TABS
1.0000 | ORAL_TABLET | Freq: Two times a day (BID) | ORAL | 0 refills | Status: AC
  Filled 2024-02-28: qty 10, 5d supply, fill #0

## 2024-02-28 MED FILL — Fluticasone Furoate-Vilanterol Aero Powd BA 200-25 MCG/ACT: 1.0000 | RESPIRATORY_TRACT | 30 days supply | Qty: 60 | Fill #0 | Status: AC

## 2024-02-28 NOTE — Discharge Summary (Signed)
 [facility] Discharge Summary   Patient: Sharon Watson FMW:[id number]  PCP: Valora Lynwood FALCON, MD  Date of admission: 02/26/2024   Date of discharge:  02/28/2024     Discharge Diagnoses:  Principal Problem:   Bronchitis Active Problems:   COPD with acute exacerbation (HCC)   Admitted From: Home Disposition:  Home   Recommendations for Outpatient Follow-up:  PCP: in 1 wk Follow up LABS/TEST:     Diet recommendation: Cardiac diet  Activity: The patient is advised to gradually reintroduce usual activities, as tolerated  Discharge Condition: stable  Code Status: Full code   History of present illness: As per the H and P dictated on admission.  Hospital Course:  Sharon Watson is a 78 y.o. female with PMH of COPD, asthma, HTN, Mnire's disease with vertigo, PE after foot fracture in 2014, not on [facility], presented at [facility] ED with complaining of productive cough for over 2 weeks.  Initially patient developed shortness of breath in [place]  she went to urgent care where she was diagnosed with bronchitis, received antibiotics and breathing treatments which helped her but she again developed cough so she went to walk-in clinic and she was given diagnosis of bronchitis, and prescribed azithromycin, prednisone  and inhalers but her condition is not getting better so she came in for ED for further evaluation.  Patient feels sinus congestion, hoarseness of voice and wheezes.  Phlegm is yellow color.  Denies any fever, no chest pain, no abdominal pain, no any other complaints     ED Course: Vital signs stable CMP BG 104, BUN 28, phosphorus 2.8, rest within normal range BNP 462, slightly elevated Troponin x 1 negative CBC: WBC 13.2 elevated CTA negative for PE, no pneumonia, shows bronchitis CXR negative for any infection   Assessment and Plan:  # Acute COPD exacerbation 2/2 metapneumovirus bronchitis and possible bacterial superinfection Supplemental O2 and elation as needed Negative  COVID, flu and RSV,  Positive metapneumovirus (RVP panel) S/p Solu-Medrol  125 mg x 1 dose given in the ED Started prednisone  40 mg p.o. daily x 5 days S/p ceftriaxone  1 g IV daily and doxycycline  100 mg p.o. twice daily. Mucinex  600 mg p.o. twice daily, Tussionex as needed, Tessalon  Perles as needed. S/p DuoNeb every 6 hourly scheduled. Breo Ellipta  inhaler, Claritin  10 mg p.o. daily 12/10 Cepacol lozenges 3 times daily x 2 doses given Patient is feeling improvement, stable to discharge on oral prednisone , Breo Ellipta  inhaler, DuoNeb, doxycycline  and Augmentin  for 5 days.  Follow-up with PCP in 1 week.   # Hypophosphatemia, Phos repleted.  # Hypertension: BP stable Resume home medication.   H/o Mnire's disease and vertigo No active disease   Body mass index is 25.79 kg/m.  Nutrition Interventions:  Patient was ambulatory without any assistance.  On the day of the discharge the patient's vitals were stable, and no other acute medical condition were reported by patient. the patient was felt safe to be discharge at Home.  Consultants: None Procedures: None  Discharge Exam: General: Appear in no distress, Oral Mucosa Clear, moist. Cardiovascular: S1 and S2 Present, no Murmur, Respiratory: normal respiratory effort, Bilateral Air entry present and no Crackles, mild wheezes Abdomen: Bowel Sound present, Soft and no tenderness. Extremities: no Pedal edema, no calf tenderness Neurology: alert and oriented to time, place, and person affect appropriate.  Filed Weights   02/26/24 1238  Weight: 70.3 kg   Vitals:   02/28/24 0847 02/28/24 1320  BP: 136/68   Pulse: 82   Resp: 18  Temp: (!) 97.4 F (36.3 C)   SpO2: 94% 93%    DISCHARGE MEDICATION: Allergies as of 02/28/2024       Reactions   Cefdinir    Abdominal Pain        Medication List     STOP taking these medications    azithromycin 250 MG tablet Commonly known as: ZITHROMAX   clobetasol  0.05 % external  solution Commonly known as: TEMOVATE    hydrochlorothiazide  12.5 MG tablet Commonly known as: HYDRODIURIL    Ipratropium-Albuterol  20-100 MCG/ACT Aers respimat Commonly known as: COMBIVENT Replaced by: ipratropium-albuterol  0.5-2.5 (3) MG/3ML Soln   oxyCODONE  5 MG immediate release tablet Commonly known as: Roxicodone        TAKE these medications    amoxicillin -clavulanate 875-125 MG tablet Commonly known as: AUGMENTIN  Take 1 tablet by mouth 2 (two) times daily for 5 days.   ascorbic acid 500 MG tablet Commonly known as: VITAMIN C Take 500 mg by mouth daily.   aspirin-acetaminophen -caffeine  250-250-65 MG tablet Commonly known as: EXCEDRIN MIGRAINE Take 2 tablets by mouth every 6 (six) hours as needed for headache.   Azelastine HCl 137 MCG/SPRAY Soln Place 1 spray into both nostrils 2 (two) times daily.   CALCIUM 600 + D PO Take 1 tablet by mouth daily.   chlorpheniramine-HYDROcodone  10-8 MG/5ML Commonly known as: TUSSIONEX Take 2.5-5 mLs by mouth at bedtime as needed for cough.   doxycycline  100 MG tablet Commonly known as: VIBRA -TABS Take 1 tablet (100 mg total) by mouth every 12 (twelve) hours for 5 days.   estradiol 0.5 MG tablet Commonly known as: ESTRACE Take 0.5 mg by mouth daily.   famotidine 20 MG tablet Commonly known as: PEPCID Take 20 mg by mouth at bedtime.   fluticasone  furoate-vilanterol 200-25 MCG/ACT Aepb Commonly known as: BREO ELLIPTA  Inhale 1 puff into the lungs daily. Start taking on: February 29, 2024   guaiFENesin  600 MG 12 hr tablet Commonly known as: MUCINEX  Take 1 tablet (600 mg total) by mouth 2 (two) times daily for 7 days.   ipratropium-albuterol  0.5-2.5 (3) MG/3ML Soln Commonly known as: DUONEB Take 3 mLs by nebulization every 6 (six) hours as needed. Replaces: Ipratropium-Albuterol  20-100 MCG/ACT Aers respimat   lisinopril -hydrochlorothiazide  10-12.5 MG tablet Commonly known as: ZESTORETIC  Take 1 tablet by mouth daily.    loratadine  10 MG tablet Commonly known as: CLARITIN  Take 1 tablet (10 mg total) by mouth daily. Start taking on: February 29, 2024   METHYL B-12 PO Take 1,000 mcg by mouth daily.   Motrin Dual Action 125-250 MG Tabs Generic drug: Ibuprofen-Acetaminophen  Take 2 tablets by mouth at bedtime.   multivitamin with minerals Tabs tablet Take 1 tablet by mouth daily.   OVER THE COUNTER MEDICATION Take 2 capsules by mouth daily. Vertisyl otc vertigo relief   pantoprazole  40 MG tablet Commonly known as: PROTONIX  Take 1 tablet (40 mg total) by mouth daily. Start taking on: February 29, 2024   predniSONE  20 MG tablet Commonly known as: DELTASONE  Take 2 tablets (40 mg total) by mouth daily with breakfast for 5 days. Start taking on: February 29, 2024 What changed:  how much to take when to take this   PROBIOTIC PO Take 1 capsule by mouth daily.       Allergies[1] Discharge Instructions     Call MD for:  difficulty breathing, headache or visual disturbances   Complete by: As directed    Call MD for:  extreme fatigue   Complete by: As directed  Call MD for:  persistant dizziness or light-headedness   Complete by: As directed    Call MD for:  persistant nausea and vomiting   Complete by: As directed    Call MD for:  severe uncontrolled pain   Complete by: As directed    Call MD for:  temperature >100.4   Complete by: As directed    Discharge instructions   Complete by: As directed    Follow-up with PCP in 1 week   Increase activity slowly   Complete by: As directed        The results of significant diagnostics from this hospitalization (including imaging, microbiology, ancillary and laboratory) are listed below for reference.    Significant Diagnostic Studies: CT Angio Chest PE W and/or Wo Contrast Result Date: 02/26/2024 CLINICAL DATA:  Short of breath, history of pulmonary embolus, bronchitis EXAM: CT ANGIOGRAPHY CHEST WITH CONTRAST TECHNIQUE: Multidetector CT  imaging of the chest was performed using the standard protocol during bolus administration of intravenous contrast. Multiplanar CT image reconstructions and MIPs were obtained to evaluate the vascular anatomy. RADIATION DOSE REDUCTION: This exam was performed according to the departmental dose-optimization program which includes automated exposure control, adjustment of the mA and/or kV according to patient size and/or use of iterative reconstruction technique. CONTRAST:  75mL OMNIPAQUE  IOHEXOL  350 MG/ML SOLN COMPARISON:  10/13/2020 FINDINGS: Cardiovascular: This is a technically adequate evaluation of the pulmonary vasculature. No filling defects or pulmonary emboli. Dilated main pulmonary artery measuring up to 3.5 cm, consistent with pulmonary arterial hypertension. The heart is enlarged without pericardial effusion. No evidence of thoracic aortic aneurysm or dissection. Atherosclerosis of the aorta and coronary vasculature. Mediastinum/Nodes: No enlarged mediastinal, hilar, or axillary lymph nodes. Thyroid  gland, trachea, and esophagus demonstrate no significant findings. Lungs/Pleura: There is bilateral bronchial wall thickening consistent with bronchitis. No airspace disease, effusion, or pneumothorax. Upper Abdomen: No acute upper abdominal findings. Stable left adrenal adenoma. Musculoskeletal: No acute or destructive bony abnormalities. Reconstructed images demonstrate no additional findings. Review of the MIP images confirms the above findings. IMPRESSION: 1. No evidence of pulmonary embolus. 2. Bilateral bronchial wall thickening consistent with bronchitis. No evidence of pneumonia. 3. Dilated main pulmonary artery consistent with pulmonary arterial hypertension. 4. Aortic Atherosclerosis (ICD10-I70.0). Coronary artery atherosclerosis. 5. Cardiomegaly. Electronically Signed   By: Ozell Daring M.D.   On: 02/26/2024 16:12   DG Chest 2 View Result Date: 02/26/2024 CLINICAL DATA:  Short of breath EXAM:  CHEST - 2 VIEW COMPARISON:  10/13/2020 FINDINGS: Frontal and lateral views of the chest demonstrate an unremarkable cardiac silhouette. No acute airspace disease, effusion, or pneumothorax. No acute bony abnormalities. IMPRESSION: 1. No acute intrathoracic process. Electronically Signed   By: Ozell Daring M.D.   On: 02/26/2024 15:07    Microbiology: Recent Results (from the past 240 hours)  Resp panel by RT-PCR (RSV, Flu A&B, Covid) Anterior Nasal Swab     Status: None   Collection Time: 02/26/24  6:02 PM   Specimen: Anterior Nasal Swab  Result Value Ref Range Status   SARS Coronavirus 2 by RT PCR NEGATIVE NEGATIVE Final    Comment: (NOTE) SARS-CoV-2 target nucleic acids are NOT DETECTED.  The SARS-CoV-2 RNA is generally detectable in upper respiratory specimens during the acute phase of infection. The lowest concentration of SARS-CoV-2 viral copies this assay can detect is 138 copies/mL. A negative result does not preclude SARS-Cov-2 infection and should not be used as the sole basis for treatment or other patient management decisions. A  negative result may occur with  improper specimen collection/handling, submission of specimen other than nasopharyngeal swab, presence of viral mutation(s) within the areas targeted by this assay, and inadequate number of viral copies(<138 copies/mL). A negative result must be combined with clinical observations, patient history, and epidemiological information. The expected result is Negative.  Fact Sheet for Patients:  bloggercourse.com  Fact Sheet for Healthcare Providers:  seriousbroker.it  This test is no t yet approved or cleared by the [place]  FDA and  has been authorized for detection and/or diagnosis of SARS-CoV-2 by FDA under an Emergency Use Authorization (EUA). This EUA will remain  in effect (meaning this test can be used) for the duration of the COVID-19 declaration under  Section 564(b)(1) of the Act, 21 U.S.C.section 360bbb-3(b)(1), unless the authorization is terminated  or revoked sooner.       Influenza A by PCR NEGATIVE NEGATIVE Final   Influenza B by PCR NEGATIVE NEGATIVE Final    Comment: (NOTE) The Xpert Xpress SARS-CoV-2/FLU/RSV plus assay is intended as an aid in the diagnosis of influenza from Nasopharyngeal swab specimens and should not be used as a sole basis for treatment. Nasal washings and aspirates are unacceptable for Xpert Xpress SARS-CoV-2/FLU/RSV testing.  Fact Sheet for Patients: bloggercourse.com  Fact Sheet for Healthcare Providers: seriousbroker.it  This test is not yet approved or cleared by the [place]  FDA and has been authorized for detection and/or diagnosis of SARS-CoV-2 by FDA under an Emergency Use Authorization (EUA). This EUA will remain in effect (meaning this test can be used) for the duration of the COVID-19 declaration under Section 564(b)(1) of the Act, 21 U.S.C. section 360bbb-3(b)(1), unless the authorization is terminated or revoked.     Resp Syncytial Virus by PCR NEGATIVE NEGATIVE Final    Comment: (NOTE) Fact Sheet for Patients: bloggercourse.com  Fact Sheet for Healthcare Providers: seriousbroker.it  This test is not yet approved or cleared by the [place]  FDA and has been authorized for detection and/or diagnosis of SARS-CoV-2 by FDA under an Emergency Use Authorization (EUA). This EUA will remain in effect (meaning this test can be used) for the duration of the COVID-19 declaration under Section 564(b)(1) of the Act, 21 U.S.C. section 360bbb-3(b)(1), unless the authorization is terminated or revoked.  Performed at [facility], [address] [place], [place] [address]   MRSA Next Gen by PCR, Nasal     Status: Abnormal   Collection Time: 02/26/24  6:02 PM   Specimen:  Nasal Mucosa; Nasal Swab  Result Value Ref Range Status   MRSA by PCR Next Gen DETECTED (A) NOT DETECTED Final    Comment: RESULT CALLED TO, READ BACK BY AND VERIFIED WITH: MONIQUE CURTIS AT 2010 ON 02/26/24 BY SS (NOTE) The GeneXpert MRSA Assay (FDA approved for NASAL specimens only), is one component of a comprehensive MRSA colonization surveillance program. It is not intended to diagnose MRSA infection nor to guide or monitor treatment for MRSA infections. Test performance is not FDA approved in patients less than 49 years old. Performed at [facility], [address] [place], [place] [address]   Respiratory (~20 pathogens) panel by PCR     Status: Abnormal   Collection Time: 02/26/24  6:02 PM  Result Value Ref Range Status   Adenovirus NOT DETECTED NOT DETECTED Final   Coronavirus 229E NOT DETECTED NOT DETECTED Final    Comment: (NOTE) The Coronavirus on the Respiratory Panel, DOES NOT test for the novel  Coronavirus (2019 nCoV)  Coronavirus HKU1 NOT DETECTED NOT DETECTED Final   Coronavirus NL63 NOT DETECTED NOT DETECTED Final   Coronavirus OC43 NOT DETECTED NOT DETECTED Final   Metapneumovirus DETECTED (A) NOT DETECTED Final   Rhinovirus / Enterovirus NOT DETECTED NOT DETECTED Final   Influenza A NOT DETECTED NOT DETECTED Final   Influenza B NOT DETECTED NOT DETECTED Final   Parainfluenza Virus 1 NOT DETECTED NOT DETECTED Final   Parainfluenza Virus 2 NOT DETECTED NOT DETECTED Final   Parainfluenza Virus 3 NOT DETECTED NOT DETECTED Final   Parainfluenza Virus 4 NOT DETECTED NOT DETECTED Final   Respiratory Syncytial Virus NOT DETECTED NOT DETECTED Final   Bordetella pertussis NOT DETECTED NOT DETECTED Final   Bordetella Parapertussis NOT DETECTED NOT DETECTED Final   Chlamydophila pneumoniae NOT DETECTED NOT DETECTED Final   Mycoplasma pneumoniae NOT DETECTED NOT DETECTED Final    Comment: Performed at [facility], [address], [place], [place]  [address]     Labs: CBC: Recent Labs  Lab 02/26/24 1243 02/27/24 0422  WBC 13.2* 7.9  HGB 12.0 11.2*  HCT 34.7* 33.5*  MCV 94.3 96.3  PLT 370 339   Basic Metabolic Panel: Recent Labs  Lab 02/26/24 1243 02/27/24 0422  NA 140 138  K 3.8 4.1  CL 101 101  CO2 26 23  GLUCOSE 104* 216*  BUN 28* 27*  CREATININE 0.93 0.89  CALCIUM 9.5 9.3  MG 2.3 2.3  PHOS 2.4* 2.2*   Liver Function Tests: No results for input(s): AST, ALT, ALKPHOS, BILITOT, PROT, ALBUMIN in the last 168 hours. No results for input(s): LIPASE, AMYLASE in the last 168 hours. No results for input(s): AMMONIA in the last 168 hours. Cardiac Enzymes: No results for input(s): CKTOTAL, CKMB, CKMBINDEX, TROPONINI in the last 168 hours. BNP (last 3 results) No results for input(s): BNP in the last 8760 hours. CBG: No results for input(s): GLUCAP in the last 168 hours.  Time spent: 35 minutes  Signed:  Elvan Sor  [facility] 02/28/2024 3:44 PM      [1]  Allergies Allergen Reactions   Cefdinir     Abdominal Pain

## 2024-02-28 NOTE — TOC CM/SW Note (Signed)
 Transition of Care [facility]) - Inpatient Brief Assessment   Patient Details  Name: Sharon Watson MRN: [id number] Date of Birth: 06/23/1945  Transition of Care [facility]) CM/SW Contact:    Shasta DELENA Daring, RN Phone Number: 02/28/2024, 10:27 AM   Clinical Narrative: Transition of Care Department [facility]) has reviewed patient and no TOC needs have been identified at this time.  If new patient transition needs arise, please place a TOC consult.    Transition of Care Asessment: Insurance and Status: Insurance coverage has been reviewed Patient has primary care physician: Yes Home environment has been reviewed: Lives at home Prior level of function:: Independent   Social Drivers of Health Review: SDOH reviewed no interventions necessary Readmission risk has been reviewed: Yes Transition of care needs: no transition of care needs at this time

## 2024-04-25 ENCOUNTER — Other Ambulatory Visit (HOSPITAL_COMMUNITY): Payer: Self-pay

## 2024-04-25 ENCOUNTER — Other Ambulatory Visit: Payer: Self-pay
